# Patient Record
Sex: Female | Born: 1960 | Hispanic: Yes | Marital: Married | Smoking: Never smoker
Health system: Southern US, Community
[De-identification: ages and names within clinical notes are randomized; demographics above are authoritative.]

## PROBLEM LIST (undated history)

## (undated) DIAGNOSIS — I10 Essential (primary) hypertension: Secondary | ICD-10-CM

## (undated) DIAGNOSIS — E7800 Pure hypercholesterolemia, unspecified: Secondary | ICD-10-CM

## (undated) DIAGNOSIS — E119 Type 2 diabetes mellitus without complications: Secondary | ICD-10-CM

## (undated) HISTORY — DX: Essential (primary) hypertension: I10

---

## 2013-06-01 LAB — HM COLONOSCOPY

## 2017-07-20 HISTORY — PX: CHOLECYSTECTOMY: SHX55

## 2018-03-07 LAB — BASIC METABOLIC PANEL
BUN: 15 (ref 4–21)
Chloride: 102 (ref 99–108)
Creatinine: 0.6 (ref 0.5–1.1)
Glucose: 105
Potassium: 4.1 (ref 3.4–5.3)
Sodium: 143 (ref 137–147)

## 2018-03-07 LAB — COMPREHENSIVE METABOLIC PANEL
Calcium: 9.7 (ref 8.7–10.7)
GFR calc Af Amer: 119
GFR calc non Af Amer: 103

## 2018-03-07 LAB — LIPID PANEL
Cholesterol: 211 — AB (ref 0–200)
HDL: 69 (ref 35–70)
LDL Cholesterol: 125
Triglycerides: 84 (ref 40–160)

## 2018-03-07 LAB — HM PAP SMEAR: HM Pap smear: 100319

## 2018-03-07 LAB — HEPATIC FUNCTION PANEL
ALT: 29 (ref 7–35)
AST: 23 (ref 13–35)
Alkaline Phosphatase: 144 — AB (ref 25–125)
Bilirubin, Total: 0.3

## 2018-03-07 LAB — TSH: TSH: 1.4 (ref 0.41–5.90)

## 2018-03-07 LAB — VITAMIN D 25 HYDROXY (VIT D DEFICIENCY, FRACTURES): Vit D, 25-Hydroxy: 33

## 2018-03-07 LAB — HEMOGLOBIN A1C: Hemoglobin A1C: 6.4

## 2018-04-15 LAB — HM PAP SMEAR: HM Pap smear: NEGATIVE

## 2018-04-20 LAB — HM PAP SMEAR: HM Pap smear: NEGATIVE

## 2018-04-20 LAB — RESULTS CONSOLE HPV: CHL HPV: NEGATIVE

## 2019-08-28 ENCOUNTER — Encounter: Payer: Self-pay | Admitting: Internal Medicine

## 2019-08-28 ENCOUNTER — Ambulatory Visit (INDEPENDENT_AMBULATORY_CARE_PROVIDER_SITE_OTHER): Admitting: Internal Medicine

## 2019-08-28 ENCOUNTER — Other Ambulatory Visit: Payer: Self-pay

## 2019-08-28 VITALS — BP 136/70 | HR 63 | Temp 98.3°F | Ht 62.0 in | Wt 154.0 lb

## 2019-08-28 DIAGNOSIS — I10 Essential (primary) hypertension: Secondary | ICD-10-CM | POA: Diagnosis not present

## 2019-08-28 DIAGNOSIS — Z23 Encounter for immunization: Secondary | ICD-10-CM

## 2019-08-28 DIAGNOSIS — Z1231 Encounter for screening mammogram for malignant neoplasm of breast: Secondary | ICD-10-CM | POA: Diagnosis not present

## 2019-08-28 DIAGNOSIS — L84 Corns and callosities: Secondary | ICD-10-CM

## 2019-08-28 MED ORDER — LOSARTAN POTASSIUM 25 MG PO TABS: 25.0000 mg | ORAL_TABLET | Freq: Every day | ORAL | 1 refills | Status: DC

## 2019-08-28 NOTE — Progress Notes (Signed)
Date:  08/28/2019   Name:  Jenny Gordon   DOB:  [birth year]   MRN:  [id number]   Chief Complaint: Establish Care (Flu shot.) and Hypertension (Refill. )  Hypertension This is a chronic problem. Episode onset: 6 years ago. The problem is controlled. Pertinent negatives include no chest pain, headaches, palpitations or shortness of breath. Past treatments include angiotensin blockers. The current treatment provides significant improvement. There are no compliance problems.  There is no history of kidney disease or CAD/MI.  Foot Injury  There was no injury mechanism. The pain is present in the left foot. The pain is mild. Associated symptoms comments: Pain on the bottom of the foot with walking.    No results found for: CREATININE, BUN, NA, K, CL, CO2 No results found for: CHOL, HDL, LDLCALC, LDLDIRECT, TRIG, CHOLHDL No results found for: TSH No results found for: HGBA1C   Review of Systems  Constitutional: Negative for chills and fatigue.  HENT: Positive for congestion (mild sinus congestion).   Respiratory: Negative for chest tightness and shortness of breath.   Cardiovascular: Negative for chest pain, palpitations and leg swelling.  Gastrointestinal: Negative for abdominal pain.  Musculoskeletal: Positive for gait problem (left foot pain).  Allergic/Immunologic: Positive for environmental allergies.  Neurological: Negative for dizziness, light-headedness and headaches.  Psychiatric/Behavioral: Positive for dysphoric mood (a little sad since her ex sister in law passed away recently from Covid). Negative for sleep disturbance. The patient is not nervous/anxious.     Patient Active Problem List   Diagnosis Date Noted  . Essential hypertension 08/28/2019  . Callus of foot 08/28/2019    Allergies  Allergen Reactions  . Penicillins Hives    Past Surgical History:  Procedure Laterality Date  . CHOLECYSTECTOMY      Social History   Tobacco Use  . Smoking status: Never  Smoker  . Smokeless tobacco: Never Used  Substance Use Topics  . Alcohol use: Yes    Comment: occassional  . Drug use: Never     Medication list has been reviewed and updated.  Current Meds  Medication Sig  . Glucosamine-Chondroitin 500-400 MG CHEW Chew 1 tablet by mouth daily.  Marland Kitchen losartan (COZAAR) 25 MG tablet Take 1 tablet (25 mg total) by mouth daily.  . [DISCONTINUED] losartan (COZAAR) 25 MG tablet Take 25 mg by mouth daily.    PHQ 2/9 Scores 08/28/2019  PHQ - 2 Score 2  PHQ- 9 Score 2    BP Readings from Last 3 Encounters:  08/28/19 136/70    Physical Exam Vitals and nursing note reviewed.  Constitutional:      General: She is not in acute distress.    Appearance: Normal appearance. She is well-developed.  HENT:     Head: Normocephalic and atraumatic.  Neck:     Vascular: No carotid bruit.  Cardiovascular:     Rate and Rhythm: Normal rate and regular rhythm.     Pulses: Normal pulses.     Heart sounds: No murmur.  Pulmonary:     Effort: Pulmonary effort is normal. No respiratory distress.  Abdominal:     General: Abdomen is flat.     Palpations: Abdomen is soft.     Tenderness: There is no abdominal tenderness.  Musculoskeletal:     Cervical back: Normal range of motion.     Right lower leg: No edema.     Left lower leg: No edema.       Feet:  Lymphadenopathy:  Cervical: No cervical adenopathy.  Skin:    General: Skin is warm and dry.     Capillary Refill: Capillary refill takes less than 2 seconds.     Findings: No rash.  Neurological:     Mental Status: She is alert and oriented to person, place, and time.  Psychiatric:        Behavior: Behavior normal.        Thought Content: Thought content normal.     Wt Readings from Last 3 Encounters:  08/28/19 154 lb (69.9 kg)    BP 136/70   Pulse 63   Temp 98.3 F (36.8 C) (Oral)   Ht 5\' 2"  (1.575 m)   Wt 154 lb (69.9 kg)   LMP 07/21/1999 (Exact Date)   SpO2 97%   BMI 28.17 kg/m    Assessment and Plan: 1. Essential hypertension Clinically stable exam with well controlled BP on losartan. Tolerating medications without side effects at this time. Pt to continue current regimen and low sodium diet; benefits of regular exercise as able discussed. - losartan (COZAAR) 25 MG tablet; Take 1 tablet (25 mg total) by mouth daily.  Dispense: 90 tablet; Refill: 1  2. Callus of foot Needs to see Podiatry - Ambulatory referral to Podiatry  3. Encounter for screening mammogram for breast cancer Pt will sign for previous mammograms at Radiology then schedule - MM 3D Dora; Future  4. Need for immunization against influenza - Flu Vaccine QUAD 36+ mos IM   Partially dictated using Editor, commissioning. Any errors are unintentional.  Halina Maidens, MD [facility]  08/28/2019

## 2019-08-28 NOTE — Patient Instructions (Signed)
Calcium 1200 mg per day and Vitamin D IU 400 per day

## 2019-09-06 ENCOUNTER — Encounter: Payer: Self-pay | Admitting: Internal Medicine

## 2019-09-06 DIAGNOSIS — R935 Abnormal findings on diagnostic imaging of other abdominal regions, including retroperitoneum: Secondary | ICD-10-CM | POA: Insufficient documentation

## 2019-09-20 ENCOUNTER — Other Ambulatory Visit: Payer: Self-pay

## 2019-10-09 ENCOUNTER — Ambulatory Visit: Admitting: Internal Medicine

## 2019-10-23 ENCOUNTER — Other Ambulatory Visit: Payer: Self-pay

## 2019-10-23 ENCOUNTER — Ambulatory Visit
Admission: RE | Admit: 2019-10-23 | Discharge: 2019-10-23 | Disposition: A | Discharge status: Home / Self Care | Source: Ambulatory Visit | Attending: Internal Medicine | Admitting: Internal Medicine

## 2019-10-23 DIAGNOSIS — Z1231 Encounter for screening mammogram for malignant neoplasm of breast: Secondary | ICD-10-CM | POA: Diagnosis present

## 2020-02-03 ENCOUNTER — Other Ambulatory Visit: Payer: Self-pay | Admitting: Internal Medicine

## 2020-02-03 DIAGNOSIS — I10 Essential (primary) hypertension: Secondary | ICD-10-CM

## 2020-03-19 NOTE — Progress Notes (Signed)
Date:  03/20/2020   Name:  Jenny Gordon   DOB:  [birth year]   MRN:  [id number]   Chief Complaint: Annual Exam (Breast Exam, No pap- 3 more years.  / not taking glucosomine wants to take gummies) and Colon Cancer Screening (FIT)  Jenny Gordon is a 59 y.o. female who presents today for her Complete Annual Exam. She feels well. She reports exercising none. She reports she is sleeping well. Breast complaints none.  Mammogram: 10/2019 Pap smear: 04/2018 neg with cotesting Colonoscopy: 2017 - ? Polyp biopsied  Immunization History  Administered Date(s) Administered  . Influenza,inj,Quad PF,6+ Mos 08/28/2019  . Moderna SARS-COVID-2 Vaccination 02/28/2020, 03/20/2020    Hypertension This is a chronic problem. The problem is controlled. Pertinent negatives include no chest pain, headaches, palpitations or shortness of breath. Past treatments include angiotensin blockers. The current treatment provides significant improvement. There are no compliance problems.  There is no history of kidney disease, CAD/MI or CVA.    Lab Results  Component Value Date   CREATININE 0.6 03/07/2018   BUN 15 03/07/2018   NA 143 03/07/2018   K 4.1 03/07/2018   CL 102 03/07/2018   Lab Results  Component Value Date   CHOL 211 (A) 03/07/2018   HDL 69 03/07/2018   LDLCALC 125 03/07/2018   TRIG 84 03/07/2018   Lab Results  Component Value Date   TSH 1.40 03/07/2018   Lab Results  Component Value Date   HGBA1C 6.4 03/07/2018   No results found for: WBC, HGB, HCT, MCV, PLT Lab Results  Component Value Date   ALT 29 03/07/2018   AST 23 03/07/2018   ALKPHOS 144 (A) 03/07/2018     Review of Systems  Constitutional: Negative for chills, fatigue and fever.  HENT: Negative for congestion, hearing loss, tinnitus, trouble swallowing and voice change.   Eyes: Negative for visual disturbance.  Respiratory: Negative for cough, chest tightness, shortness of breath and wheezing.   Cardiovascular: Negative for  chest pain, palpitations and leg swelling.  Gastrointestinal: Negative for abdominal pain, constipation, diarrhea and vomiting.  Endocrine: Negative for polydipsia and polyuria.  Genitourinary: Negative for dysuria, frequency, genital sores, vaginal bleeding and vaginal discharge.  Musculoskeletal: Negative for arthralgias, gait problem and joint swelling.  Skin: Negative for color change and rash.  Neurological: Negative for dizziness, tremors, light-headedness and headaches.  Hematological: Negative for adenopathy. Does not bruise/bleed easily.  Psychiatric/Behavioral: Negative for dysphoric mood and sleep disturbance. The patient is not nervous/anxious.     Patient Active Problem List   Diagnosis Date Noted  . Abnormal MRI of abdomen 09/06/2019  . Essential hypertension 08/28/2019  . Callus of foot 08/28/2019    Allergies  Allergen Reactions  . Penicillins Hives    Past Surgical History:  Procedure Laterality Date  . CHOLECYSTECTOMY  2019    Social History   Tobacco Use  . Smoking status: Never Smoker  . Smokeless tobacco: Never Used  Vaping Use  . Vaping Use: Never assessed  Substance Use Topics  . Alcohol use: Yes    Comment: occassional  . Drug use: Never     Medication list has been reviewed and updated.  Current Meds  Medication Sig  . losartan (COZAAR) 25 MG tablet TAKE 1 TABLET(25 MG) BY MOUTH DAILY    PHQ 2/9 Scores 08/28/2019  PHQ - 2 Score 2  PHQ- 9 Score 2    No flowsheet data found.  BP Readings from Last 3 Encounters:  03/20/20 132/76  08/28/19 136/70    Physical Exam Vitals and nursing note reviewed.  Constitutional:      General: She is not in acute distress.    Appearance: She is well-developed.  HENT:     Head: Normocephalic and atraumatic.     Right Ear: Tympanic membrane and ear canal normal.     Left Ear: Tympanic membrane and ear canal normal.     Nose:     Right Sinus: No maxillary sinus tenderness.     Left Sinus: No  maxillary sinus tenderness.  Eyes:     General: No scleral icterus.       Right eye: No discharge.        Left eye: No discharge.     Conjunctiva/sclera: Conjunctivae normal.  Neck:     Thyroid: No thyromegaly.     Vascular: No carotid bruit.  Cardiovascular:     Rate and Rhythm: Normal rate and regular rhythm.     Pulses: Normal pulses.     Heart sounds: Normal heart sounds.  Pulmonary:     Effort: Pulmonary effort is normal. No respiratory distress.     Breath sounds: No wheezing.  Chest:     Breasts:        Right: No mass, nipple discharge, skin change or tenderness.        Left: No mass, nipple discharge, skin change or tenderness.  Abdominal:     General: Bowel sounds are normal.     Palpations: Abdomen is soft.     Tenderness: There is no abdominal tenderness.  Musculoskeletal:     Cervical back: Normal range of motion. No erythema.     Right knee: Crepitus present. No effusion. Normal range of motion. No tenderness.     Left knee: Crepitus present. No effusion. Normal range of motion. No tenderness.     Right lower leg: No edema.     Left lower leg: No edema.  Lymphadenopathy:     Cervical: No cervical adenopathy.  Skin:    General: Skin is warm and dry.     Capillary Refill: Capillary refill takes less than 2 seconds.     Findings: No rash.  Neurological:     General: No focal deficit present.     Mental Status: She is alert and oriented to person, place, and time.     Cranial Nerves: No cranial nerve deficit.     Sensory: No sensory deficit.     Deep Tendon Reflexes: Reflexes are normal and symmetric.  Psychiatric:        Attention and Perception: Attention normal.        Mood and Affect: Mood normal.        Behavior: Behavior normal.     Wt Readings from Last 3 Encounters:  03/20/20 154 lb (69.9 kg)  08/28/19 154 lb (69.9 kg)    BP 132/76   Pulse 70   Temp 98 F (36.7 C) (Oral)   Ht 5\' 2"  (1.575 m)   Wt 154 lb (69.9 kg)   LMP 07/21/1999 (Exact  Date)   SpO2 97%   BMI 28.17 kg/m   Assessment and Plan: 1. Annual physical exam Normal exam Needs to resume regular exercise - Lipid panel - POCT urinalysis dipstick  2. Need for hepatitis C screening test - Hepatitis C antibody  3. Colon cancer screening [place] Per patient she had it at age 58 and there was something that was biopsied  but only required monitoring - Ambulatory referral to Gastroenterology  4. Essential hypertension Clinically stable exam with well controlled BP on losartan 25 mg. Tolerating medications without side effects at this time. Pt to continue current regimen and low sodium diet; benefits of regular exercise as able discussed. - CBC with Differential/Platelet - Comprehensive metabolic panel - TSH - losartan (COZAAR) 25 MG tablet; Take 1 tablet (25 mg total) by mouth daily.  Dispense: 90 tablet; Refill: 3   Partially dictated using Animal nutritionist. Any errors are unintentional.  Bari Edward, MD [facility]  03/20/2020

## 2020-03-20 ENCOUNTER — Other Ambulatory Visit: Payer: Self-pay

## 2020-03-20 ENCOUNTER — Encounter: Payer: Self-pay | Admitting: Internal Medicine

## 2020-03-20 ENCOUNTER — Ambulatory Visit (INDEPENDENT_AMBULATORY_CARE_PROVIDER_SITE_OTHER): Admitting: Internal Medicine

## 2020-03-20 VITALS — BP 132/76 | HR 70 | Temp 98.0°F | Ht 62.0 in | Wt 154.0 lb

## 2020-03-20 DIAGNOSIS — Z0000 Encounter for general adult medical examination without abnormal findings: Secondary | ICD-10-CM | POA: Diagnosis not present

## 2020-03-20 DIAGNOSIS — Z1159 Encounter for screening for other viral diseases: Secondary | ICD-10-CM

## 2020-03-20 DIAGNOSIS — Z1211 Encounter for screening for malignant neoplasm of colon: Secondary | ICD-10-CM

## 2020-03-20 DIAGNOSIS — I10 Essential (primary) hypertension: Secondary | ICD-10-CM | POA: Diagnosis not present

## 2020-03-20 LAB — POCT URINALYSIS DIPSTICK
Bilirubin, UA: NEGATIVE
Blood, UA: NEGATIVE
Glucose, UA: NEGATIVE
Ketones, UA: NEGATIVE
Leukocytes, UA: NEGATIVE
Nitrite, UA: NEGATIVE
Protein, UA: NEGATIVE
Spec Grav, UA: <=1.005 — AB (ref 1.010–1.025)
Urobilinogen, UA: 0.2 E.U./dL
pH, UA: 7 (ref 5.0–8.0)

## 2020-03-20 MED ORDER — LOSARTAN POTASSIUM 25 MG PO TABS: 25.0000 mg | ORAL_TABLET | Freq: Every day | ORAL | 3 refills | Status: DC

## 2020-03-20 NOTE — Patient Instructions (Addendum)
Viactive chews- calcium + d chew (recommend daily: 1200 mg calcium and 400 IU vitamin D)

## 2020-03-21 ENCOUNTER — Encounter: Payer: Self-pay | Admitting: Internal Medicine

## 2020-03-21 DIAGNOSIS — E785 Hyperlipidemia, unspecified: Secondary | ICD-10-CM | POA: Insufficient documentation

## 2020-03-21 LAB — CBC WITH DIFFERENTIAL/PLATELET
Basophils Absolute: 0 10*3/uL (ref 0.0–0.2)
Basos: 1 %
EOS (ABSOLUTE): 0.1 10*3/uL (ref 0.0–0.4)
Eos: 2 %
Hematocrit: 37.8 % (ref 34.0–46.6)
Hemoglobin: 13.1 g/dL (ref 11.1–15.9)
Immature Grans (Abs): 0 10*3/uL (ref 0.0–0.1)
Immature Granulocytes: 0 %
Lymphocytes Absolute: 1.8 10*3/uL (ref 0.7–3.1)
Lymphs: 34 %
MCH: 30.4 pg (ref 26.6–33.0)
MCHC: 34.7 g/dL (ref 31.5–35.7)
MCV: 88 fL (ref 79–97)
Monocytes Absolute: 0.4 10*3/uL (ref 0.1–0.9)
Monocytes: 8 %
Neutrophils Absolute: 3 10*3/uL (ref 1.4–7.0)
Neutrophils: 55 %
Platelets: 198 10*3/uL (ref 150–450)
RBC: 4.31 x10E6/uL (ref 3.77–5.28)
RDW: 13.5 % (ref 11.7–15.4)
WBC: 5.4 10*3/uL (ref 3.4–10.8)

## 2020-03-21 LAB — COMPREHENSIVE METABOLIC PANEL
ALT: 29 IU/L (ref 0–32)
AST: 19 IU/L (ref 0–40)
Albumin/Globulin Ratio: 1.7 (ref 1.2–2.2)
Albumin: 4.7 g/dL (ref 3.8–4.9)
Alkaline Phosphatase: 129 IU/L — ABNORMAL HIGH (ref 48–121)
BUN/Creatinine Ratio: 20 (ref 9–23)
BUN: 12 mg/dL (ref 6–24)
Bilirubin Total: 0.3 mg/dL (ref 0.0–1.2)
CO2: 24 mmol/L (ref 20–29)
Calcium: 9.5 mg/dL (ref 8.7–10.2)
Chloride: 103 mmol/L (ref 96–106)
Creatinine, Ser: 0.61 mg/dL (ref 0.57–1.00)
GFR calc Af Amer: 116 mL/min/{1.73_m2} (ref >59)
GFR calc non Af Amer: 101 mL/min/{1.73_m2} (ref >59)
Globulin, Total: 2.8 g/dL (ref 1.5–4.5)
Glucose: 104 mg/dL — ABNORMAL HIGH (ref 65–99)
Potassium: 4.7 mmol/L (ref 3.5–5.2)
Sodium: 143 mmol/L (ref 134–144)
Total Protein: 7.5 g/dL (ref 6.0–8.5)

## 2020-03-21 LAB — TSH: TSH: 1.03 u[IU]/mL (ref 0.450–4.500)

## 2020-03-21 LAB — LIPID PANEL
Chol/HDL Ratio: 3.4 ratio (ref 0.0–4.4)
Cholesterol, Total: 214 mg/dL — ABNORMAL HIGH (ref 100–199)
HDL: 63 mg/dL (ref >39)
LDL Chol Calc (NIH): 137 mg/dL — ABNORMAL HIGH (ref 0–99)
Triglycerides: 81 mg/dL (ref 0–149)
VLDL Cholesterol Cal: 14 mg/dL (ref 5–40)

## 2020-03-21 LAB — HEPATITIS C ANTIBODY: Hep C Virus Ab: 0.1 s/co ratio (ref 0.0–0.9)

## 2020-04-01 ENCOUNTER — Telehealth

## 2020-04-01 ENCOUNTER — Telehealth: Payer: Self-pay

## 2020-04-01 NOTE — Telephone Encounter (Signed)
Discussed colonoscopy referral with patient this morning.  She said that Dr. 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  She also said she had a colonoscopy performed after the mass was found and that was normal.  Reviewed colonoscopy report and referral note from ([facility])  Dr. Julieanne Manson office regarding the pancreatic mass.  Patient was advised based on the colonoscopy report everything checked out normal.  Colonoscopy was performed 06/01/13.  She will not be due until 06/02/23.  In regards to the past pancreatic mass, I advised that the colonoscopy would only allow the physician to see the intestines not the pancreas.  I advised her to see if Dr. Judithann Graves wanted to follow up on that to order a CT Scan since the colonoscopy would not provide any information regarding the pancreas and colonoscopy was normal.  Referral has been canceled.  Will send patient a copy of her colonoscopy report and consultation note from [facility].  Thanks,  Marcelino Duster CMA

## 2020-05-02 ENCOUNTER — Other Ambulatory Visit: Payer: Self-pay | Admitting: Internal Medicine

## 2020-05-02 DIAGNOSIS — I10 Essential (primary) hypertension: Secondary | ICD-10-CM

## 2020-09-19 ENCOUNTER — Ambulatory Visit: Admitting: Internal Medicine

## 2020-10-08 ENCOUNTER — Other Ambulatory Visit: Payer: Self-pay

## 2020-10-08 ENCOUNTER — Ambulatory Visit: Admitting: Internal Medicine

## 2020-10-08 ENCOUNTER — Encounter: Payer: Self-pay | Admitting: Internal Medicine

## 2020-10-08 VITALS — BP 136/84 | HR 73 | Temp 97.9°F | Ht 62.0 in | Wt 157.0 lb

## 2020-10-08 DIAGNOSIS — I10 Essential (primary) hypertension: Secondary | ICD-10-CM

## 2020-10-08 DIAGNOSIS — K862 Cyst of pancreas: Secondary | ICD-10-CM | POA: Diagnosis not present

## 2020-10-08 DIAGNOSIS — R935 Abnormal findings on diagnostic imaging of other abdominal regions, including retroperitoneum: Secondary | ICD-10-CM | POA: Diagnosis not present

## 2020-10-08 NOTE — Progress Notes (Signed)
Date:  10/08/2020   Name:  Jenny Gordon   DOB:  August 18, 1960   MRN:  [id number]   Chief Complaint: Hypertension  Hypertension This is a chronic problem. The problem is controlled. Pertinent negatives include no chest pain, headaches, palpitations or shortness of breath. Past treatments include angiotensin blockers. The current treatment provides significant improvement. There are no compliance problems.    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  Some worrisome features but could be walled-off necrosis could be considered if there is a hx of pancreatitis.  Rec surgical oncology consult as well as GI consult for EUS and fluid analysis.  Her last consultation recommended annual MRI which is overdue. 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 No liver lesion. 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  Lab Results  Component Value Date   CREATININE 0.61 03/20/2020   BUN 12 03/20/2020   NA 143 03/20/2020   K 4.7 03/20/2020   CL 103 03/20/2020   CO2 24 03/20/2020   Lab Results  Component Value Date   CHOL 214 (H) 03/20/2020   HDL 63 03/20/2020   LDLCALC 137 (H) 03/20/2020   TRIG 81 03/20/2020   CHOLHDL 3.4 03/20/2020   Lab Results  Component Value Date   TSH 1.030 03/20/2020   Lab Results  Component Value Date   HGBA1C 6.4 03/07/2018   Lab Results  Component Value Date   WBC 5.4 03/20/2020   HGB 13.1 03/20/2020   HCT 37.8 03/20/2020   MCV 88 03/20/2020   PLT 198 03/20/2020   Lab Results  Component Value Date   ALT 29 03/20/2020   AST 19 03/20/2020   ALKPHOS 129 (H) 03/20/2020   BILITOT 0.3 03/20/2020     Review of Systems  Constitutional: Negative for chills, fatigue and unexpected weight change.  HENT: Negative for nosebleeds and trouble swallowing.   Eyes: Negative for visual disturbance.  Respiratory: Negative for cough,  chest tightness, shortness of breath and wheezing.   Cardiovascular: Negative for chest pain, palpitations and leg swelling.  Gastrointestinal: Negative for abdominal distention, abdominal pain, constipation and diarrhea.  Genitourinary: Negative for dysuria.  Neurological: Negative for dizziness, weakness, light-headedness and headaches.    Patient Active Problem List   Diagnosis Date Noted  . Mild hyperlipidemia 03/21/2020  . Abnormal MRI of abdomen 09/06/2019  . Essential hypertension 08/28/2019  . Callus of foot 08/28/2019    Allergies  Allergen Reactions  . Penicillins Hives    Past Surgical History:  Procedure Laterality Date  . CHOLECYSTECTOMY  2019    Social History   Tobacco Use  . Smoking status: Never Smoker  . Smokeless tobacco: Never Used  Substance Use Topics  . Alcohol use: Yes    Comment: occassional  . Drug use: Never     Medication list has been reviewed and updated.  Current Meds  Medication Sig  . Chlorpheniramine Maleate (ALLERGY PO) Take by mouth as needed.  Marland Kitchen losartan (COZAAR) 25 MG tablet TAKE 1 TABLET(25 MG) BY MOUTH DAILY    PHQ 2/9 Scores 10/08/2020 08/28/2019  PHQ - 2 Score 0 2  PHQ- 9 Score 0 2    GAD 7 : Generalized Anxiety Score 10/08/2020  Nervous, Anxious, on Edge 0  Control/stop worrying 0  Worry too much - different things 0  Trouble  relaxing 0  Restless 0  Easily annoyed or irritable 0  Afraid - awful might happen 0  Total GAD 7 Score 0    BP Readings from Last 3 Encounters:  10/08/20 136/84  03/20/20 132/76  08/28/19 136/70    Physical Exam Vitals and nursing note reviewed.  Constitutional:      General: She is not in acute distress.    Appearance: Normal appearance. She is well-developed.  HENT:     Head: Normocephalic and atraumatic.  Neck:     Vascular: No carotid bruit.  Cardiovascular:     Rate and Rhythm: Normal rate and regular rhythm.     Pulses: Normal pulses.  Pulmonary:     Effort: Pulmonary  effort is normal. No respiratory distress.     Breath sounds: No wheezing or rhonchi.  Abdominal:     General: Abdomen is flat. There is no distension.     Palpations: Abdomen is soft. There is no mass.     Tenderness: There is no abdominal tenderness.  Musculoskeletal:     Cervical back: Normal range of motion.     Right lower leg: No edema.     Left lower leg: No edema.  Lymphadenopathy:     Cervical: No cervical adenopathy.  Skin:    General: Skin is warm and dry.     Capillary Refill: Capillary refill takes less than 2 seconds.     Coloration: Skin is not jaundiced.     Findings: No rash.  Neurological:     General: No focal deficit present.     Mental Status: She is alert and oriented to person, place, and time.  Psychiatric:        Mood and Affect: Mood normal.        Behavior: Behavior normal.     Wt Readings from Last 3 Encounters:  10/08/20 157 lb (71.2 kg)  03/20/20 154 lb (69.9 kg)  08/28/19 154 lb (69.9 kg)    BP 136/84   Pulse 73   Temp 97.9 F (36.6 C) (Oral)   Ht 5\' 2"  (1.575 m)   Wt 157 lb (71.2 kg)   LMP 07/21/1999 (Exact Date)   SpO2 99%   BMI 28.72 kg/m   Assessment and Plan: 1. Essential hypertension Clinically stable exam with well controlled BP on losartan. Tolerating medications without side effects at this time. Pt to continue current regimen and low sodium diet; benefits of regular exercise as able discussed.  2. Abnormal MRI of abdomen Due for surveillance MRI of pancreatic mass - MR Abdomen W Wo Contrast; Future  3. Cyst of pancreas - MR Abdomen W Wo Contrast; Future   Partially dictated using 09/18/1999. Any errors are unintentional.  Animal nutritionist, MD [facility]  10/08/2020

## 2020-10-22 ENCOUNTER — Ambulatory Visit
Admission: RE | Admit: 2020-10-22 | Discharge: 2020-10-22 | Disposition: A | Discharge status: Home / Self Care | Source: Ambulatory Visit | Attending: Internal Medicine | Admitting: Internal Medicine

## 2020-10-22 ENCOUNTER — Other Ambulatory Visit: Payer: Self-pay

## 2020-10-22 DIAGNOSIS — R935 Abnormal findings on diagnostic imaging of other abdominal regions, including retroperitoneum: Secondary | ICD-10-CM

## 2020-10-22 DIAGNOSIS — K862 Cyst of pancreas: Secondary | ICD-10-CM | POA: Diagnosis present

## 2020-10-22 MED ORDER — GADOBUTROL 1 MMOL/ML IV SOLN
7.0000 mL | Freq: Once | INTRAVENOUS | Status: AC | PRN
  Administered 2020-10-22: 7 mL via INTRAVENOUS

## 2020-10-23 ENCOUNTER — Encounter: Payer: Self-pay | Admitting: Internal Medicine

## 2021-03-27 NOTE — Progress Notes (Signed)
Date:  03/28/2021   Name:  Jenny Gordon   DOB:  1960-10-13   MRN:  [id number]   Chief Complaint: Annual Exam (Breast Exam. No pap. UA. ) Jenny Gordon is a 61 y.o. female who presents today for her Complete Annual Exam. She feels well. She reports exercising - walking 3-4 times a week for 2 miles. She reports she is sleeping well. Breast complaints - none.  Mammogram: 10/2019 DEXA: none Pap smear: 04/2018 net with co-testing Colonoscopy: 04/2013 normal  Immunization History  Administered Date(s) Administered   Influenza,inj,Quad PF,6+ Mos 08/28/2019   Moderna Sars-Covid-2 Vaccination 02/28/2020, 03/20/2020    Hypertension This is a chronic problem. The problem is controlled. Pertinent negatives include no chest pain, headaches, palpitations or shortness of breath. Past treatments include angiotensin blockers. The current treatment provides significant improvement.   Pancreatic lesion- On the outside MRI abdomen examination from 12/07/2016 the complex but nonenhancing cystic lesion in the pancreatic head was noted and measured 6.7 by 4.3 by 4.9 cm, and was noted to be not substantially changed from an even further prior MRI abdomen from 06/08/2016. The lesion is roughly stable in volume on today's examination compared to reported priors, and the overall appearance on today's examination matches that described on the prior exams. 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 The extrinsic mass effect of this lesion on the extrahepatic biliary tree was also previously reported. Overall I feel this lesion is safe for observation given this established history, and periodic sonography would likely be satisfactory for surveillance. Lab Results  Component Value Date   CREATININE 0.61 03/20/2020   BUN 12 03/20/2020   NA 143 03/20/2020    K 4.7 03/20/2020   CL 103 03/20/2020   CO2 24 03/20/2020   Lab Results  Component Value Date   CHOL 214 (H) 03/20/2020   HDL 63 03/20/2020   LDLCALC 137 (H) 03/20/2020   TRIG 81 03/20/2020   CHOLHDL 3.4 03/20/2020   Lab Results  Component Value Date   TSH 1.030 03/20/2020   Lab Results  Component Value Date   HGBA1C 6.4 03/07/2018   Lab Results  Component Value Date   WBC 5.4 03/20/2020   HGB 13.1 03/20/2020   HCT 37.8 03/20/2020   MCV 88 03/20/2020   PLT 198 03/20/2020   Lab Results  Component Value Date   ALT 29 03/20/2020   AST 19 03/20/2020   ALKPHOS 129 (H) 03/20/2020   BILITOT 0.3 03/20/2020     Review of Systems  Constitutional:  Negative for chills, fatigue and fever.  HENT:  Negative for congestion, hearing loss, tinnitus, trouble swallowing and voice change.   Eyes:  Negative for visual disturbance.  Respiratory:  Negative for cough, chest tightness, shortness of breath and wheezing.   Cardiovascular:  Negative for chest pain, palpitations and leg swelling.  Gastrointestinal:  Negative for abdominal pain, constipation, diarrhea and vomiting.  Endocrine: Negative for polydipsia and polyuria.  Genitourinary:  Negative for dysuria, frequency, genital sores, vaginal bleeding and vaginal discharge.  Musculoskeletal:  Negative for arthralgias, gait problem and joint swelling.  Skin:  Negative for color change and rash.  Neurological:  Negative for dizziness, tremors, light-headedness and headaches.  Hematological:  Negative for adenopathy. Does not bruise/bleed easily.  Psychiatric/Behavioral:  Negative for dysphoric mood and sleep disturbance. The patient is not nervous/anxious.    Patient  Active Problem List   Diagnosis Date Noted   Mild hyperlipidemia 03/21/2020   Abnormal MRI of abdomen 09/06/2019   Essential hypertension 08/28/2019   Callus of foot 08/28/2019    Allergies  Allergen Reactions   Penicillins Hives    Past Surgical History:   Procedure Laterality Date   CHOLECYSTECTOMY  2019    Social History   Tobacco Use   Smoking status: Never   Smokeless tobacco: Never  Substance Use Topics   Alcohol use: Yes    Comment: occassional   Drug use: Never     Medication list has been reviewed and updated.  Current Meds  Medication Sig   Chlorpheniramine Maleate (ALLERGY PO) Take by mouth as needed.   losartan (COZAAR) 25 MG tablet TAKE 1 TABLET(25 MG) BY MOUTH DAILY    PHQ 2/9 Scores 03/28/2021 10/08/2020 08/28/2019  PHQ - 2 Score 0 0 2  PHQ- 9 Score 0 0 2    GAD 7 : Generalized Anxiety Score 03/28/2021 10/08/2020  Nervous, Anxious, on Edge 0 0  Control/stop worrying 0 0  Worry too much - different things 0 0  Trouble relaxing 0 0  Restless 0 0  Easily annoyed or irritable 0 0  Afraid - awful might happen 0 0  Total GAD 7 Score 0 0  Anxiety Difficulty Not difficult at all -    BP Readings from Last 3 Encounters:  03/28/21 112/70  10/08/20 136/84  03/20/20 132/76    Physical Exam Vitals and nursing note reviewed.  Constitutional:      General: She is not in acute distress.    Appearance: She is well-developed.  HENT:     Head: Normocephalic and atraumatic.     Right Ear: Tympanic membrane and ear canal normal.     Left Ear: Tympanic membrane and ear canal normal.     Nose:     Right Sinus: No maxillary sinus tenderness.     Left Sinus: No maxillary sinus tenderness.  Eyes:     General: No scleral icterus.       Right eye: No discharge.        Left eye: No discharge.     Conjunctiva/sclera: Conjunctivae normal.  Neck:     Thyroid: No thyromegaly.     Vascular: No carotid bruit.  Cardiovascular:     Rate and Rhythm: Normal rate and regular rhythm.     Pulses: Normal pulses.     Heart sounds: Normal heart sounds.  Pulmonary:     Effort: Pulmonary effort is normal. No respiratory distress.     Breath sounds: No wheezing.  Chest:  Breasts:    Right: No mass, nipple discharge, skin change or  tenderness.     Left: No mass, nipple discharge, skin change or tenderness.  Abdominal:     General: Abdomen is flat. Bowel sounds are normal.     Palpations: Abdomen is soft.     Tenderness: There is no abdominal tenderness.  Musculoskeletal:     Cervical back: Normal range of motion. No erythema.     Right lower leg: No edema.     Left lower leg: No edema.  Lymphadenopathy:     Cervical: No cervical adenopathy.  Skin:    General: Skin is warm and dry.     Findings: No rash.  Neurological:     Mental Status: She is alert and oriented to person, place, and time.     Cranial Nerves: No cranial nerve deficit.  Sensory: No sensory deficit.     Deep Tendon Reflexes: Reflexes are normal and symmetric.  Psychiatric:        Attention and Perception: Attention normal.        Mood and Affect: Mood normal.    Wt Readings from Last 3 Encounters:  03/28/21 158 lb (71.7 kg)  10/08/20 157 lb (71.2 kg)  03/20/20 154 lb (69.9 kg)    BP 112/70 (BP Location: Right Arm, Patient Position: Sitting, Cuff Size: Normal)   Pulse 65   Temp 98.2 F (36.8 C) (Oral)   Ht 5\' 2"  (1.575 m)   Wt 158 lb (71.7 kg)   LMP  (LMP Unknown) Comment: menopause age 38  SpO2 96%   BMI 28.90 kg/m   Assessment and Plan: 1. Annual physical exam Normal exam. Continue healthy diet and exercise She declines all vaccines today. - TSH - Lipid panel  2. Encounter for screening mammogram for breast cancer - MM 3D SCREEN BREAST BILATERAL  3. Colon cancer screening Due in 2024  4. Essential hypertension Clinically stable exam with well controlled BP. Tolerating medications without side effects at this time. Pt to continue current regimen and low sodium diet; benefits of regular exercise as able discussed. - Comprehensive metabolic panel - CBC with Differential/Platelet - POCT Urinalysis Dipstick  5. Abnormal MRI of abdomen MRI in 10/2020 appeared to be stable since 2017. Recommendation for annual  sonography which would be due 10/2021. Discussed with patient and she is agreeable.   Partially dictated using 11/2021. Any errors are unintentional.  Animal nutritionist, MD [facility]  03/28/2021

## 2021-03-28 ENCOUNTER — Ambulatory Visit (INDEPENDENT_AMBULATORY_CARE_PROVIDER_SITE_OTHER): Admitting: Internal Medicine

## 2021-03-28 ENCOUNTER — Other Ambulatory Visit: Payer: Self-pay

## 2021-03-28 ENCOUNTER — Encounter: Payer: Self-pay | Admitting: Internal Medicine

## 2021-03-28 VITALS — BP 112/70 | HR 65 | Temp 98.2°F | Ht 62.0 in | Wt 158.0 lb

## 2021-03-28 DIAGNOSIS — I10 Essential (primary) hypertension: Secondary | ICD-10-CM

## 2021-03-28 DIAGNOSIS — Z1231 Encounter for screening mammogram for malignant neoplasm of breast: Secondary | ICD-10-CM | POA: Diagnosis not present

## 2021-03-28 DIAGNOSIS — Z1211 Encounter for screening for malignant neoplasm of colon: Secondary | ICD-10-CM

## 2021-03-28 DIAGNOSIS — Z0000 Encounter for general adult medical examination without abnormal findings: Secondary | ICD-10-CM | POA: Diagnosis not present

## 2021-03-28 DIAGNOSIS — R935 Abnormal findings on diagnostic imaging of other abdominal regions, including retroperitoneum: Secondary | ICD-10-CM

## 2021-03-28 LAB — POCT URINALYSIS DIPSTICK
Bilirubin, UA: NEGATIVE
Blood, UA: NEGATIVE
Glucose, UA: NEGATIVE
Ketones, UA: NEGATIVE
Leukocytes, UA: NEGATIVE
Nitrite, UA: NEGATIVE
Protein, UA: NEGATIVE
Spec Grav, UA: <=1.005 — AB (ref 1.010–1.025)
Urobilinogen, UA: 0.2 E.U./dL
pH, UA: 6.5 (ref 5.0–8.0)

## 2021-03-29 LAB — COMPREHENSIVE METABOLIC PANEL
ALT: 27 IU/L (ref 0–32)
AST: 22 IU/L (ref 0–40)
Albumin/Globulin Ratio: 2 (ref 1.2–2.2)
Albumin: 4.8 g/dL (ref 3.8–4.9)
Alkaline Phosphatase: 130 IU/L — ABNORMAL HIGH (ref 44–121)
BUN/Creatinine Ratio: 18 (ref 9–23)
BUN: 12 mg/dL (ref 6–24)
Bilirubin Total: 0.4 mg/dL (ref 0.0–1.2)
CO2: 25 mmol/L (ref 20–29)
Calcium: 10 mg/dL (ref 8.7–10.2)
Chloride: 101 mmol/L (ref 96–106)
Creatinine, Ser: 0.66 mg/dL (ref 0.57–1.00)
Globulin, Total: 2.4 g/dL (ref 1.5–4.5)
Glucose: 120 mg/dL — ABNORMAL HIGH (ref 65–99)
Potassium: 4.4 mmol/L (ref 3.5–5.2)
Sodium: 141 mmol/L (ref 134–144)
Total Protein: 7.2 g/dL (ref 6.0–8.5)
eGFR: 102 mL/min/{1.73_m2} (ref >59)

## 2021-03-29 LAB — CBC WITH DIFFERENTIAL/PLATELET
Basophils Absolute: 0 10*3/uL (ref 0.0–0.2)
Basos: 1 %
EOS (ABSOLUTE): 0.2 10*3/uL (ref 0.0–0.4)
Eos: 3 %
Hematocrit: 40.3 % (ref 34.0–46.6)
Hemoglobin: 13.6 g/dL (ref 11.1–15.9)
Immature Grans (Abs): 0 10*3/uL (ref 0.0–0.1)
Immature Granulocytes: 0 %
Lymphocytes Absolute: 2.2 10*3/uL (ref 0.7–3.1)
Lymphs: 38 %
MCH: 29.1 pg (ref 26.6–33.0)
MCHC: 33.7 g/dL (ref 31.5–35.7)
MCV: 86 fL (ref 79–97)
Monocytes Absolute: 0.4 10*3/uL (ref 0.1–0.9)
Monocytes: 8 %
Neutrophils Absolute: 2.9 10*3/uL (ref 1.4–7.0)
Neutrophils: 50 %
Platelets: 233 10*3/uL (ref 150–450)
RBC: 4.67 x10E6/uL (ref 3.77–5.28)
RDW: 13.5 % (ref 11.7–15.4)
WBC: 5.8 10*3/uL (ref 3.4–10.8)

## 2021-03-29 LAB — LIPID PANEL
Chol/HDL Ratio: 3.5 ratio (ref 0.0–4.4)
Cholesterol, Total: 222 mg/dL — ABNORMAL HIGH (ref 100–199)
HDL: 64 mg/dL (ref >39)
LDL Chol Calc (NIH): 136 mg/dL — ABNORMAL HIGH (ref 0–99)
Triglycerides: 123 mg/dL (ref 0–149)
VLDL Cholesterol Cal: 22 mg/dL (ref 5–40)

## 2021-03-29 LAB — TSH: TSH: 1.12 u[IU]/mL (ref 0.450–4.500)

## 2021-03-31 NOTE — Progress Notes (Signed)
A1C added R73.09.  KP 

## 2021-04-01 LAB — SPECIMEN STATUS REPORT

## 2021-04-01 LAB — HGB A1C W/O EAG: Hgb A1c MFr Bld: 6.5 % — ABNORMAL HIGH (ref 4.8–5.6)

## 2021-05-07 ENCOUNTER — Ambulatory Visit

## 2021-05-22 ENCOUNTER — Other Ambulatory Visit: Payer: Self-pay

## 2021-05-22 ENCOUNTER — Ambulatory Visit
Admission: RE | Admit: 2021-05-22 | Discharge: 2021-05-22 | Disposition: A | Discharge status: Home / Self Care | Source: Ambulatory Visit | Attending: Internal Medicine | Admitting: Internal Medicine

## 2021-05-22 DIAGNOSIS — Z1231 Encounter for screening mammogram for malignant neoplasm of breast: Secondary | ICD-10-CM | POA: Diagnosis not present

## 2021-05-27 ENCOUNTER — Encounter: Payer: Self-pay | Admitting: Internal Medicine

## 2021-06-08 ENCOUNTER — Other Ambulatory Visit: Payer: Self-pay | Admitting: Internal Medicine

## 2021-06-08 DIAGNOSIS — I10 Essential (primary) hypertension: Secondary | ICD-10-CM

## 2021-06-08 NOTE — Telephone Encounter (Signed)
Requested Prescriptions  Pending Prescriptions Disp Refills  . losartan (COZAAR) 25 MG tablet [Pharmacy Med Name: LOSARTAN 25MG  TABLETS] 90 tablet 1    Sig: TAKE 1 TABLET(25 MG) BY MOUTH DAILY     Cardiovascular:  Angiotensin Receptor Blockers Passed - 06/08/2021  3:39 AM      Passed - Cr in normal range and within 180 days    Creatinine, Ser  Date Value Ref Range Status  03/28/2021 0.66 0.57 - 1.00 mg/dL Final         Passed - K in normal range and within 180 days    Potassium  Date Value Ref Range Status  03/28/2021 4.4 3.5 - 5.2 mmol/L Final         Passed - Patient is not pregnant      Passed - Last BP in normal range    BP Readings from Last 1 Encounters:  03/28/21 112/70         Passed - Valid encounter within last 6 months    Recent Outpatient Visits          2 months ago Annual physical exam   [facility], MD   8 months ago Essential hypertension   [facility], [place]   1 year ago Annual physical exam   [facility], MD   1 year ago Essential hypertension   [facility], [place]      Future Appointments            In 3 months Reubin Milan Judithann Graves, MD [facility]   In 9 months [facility], Judithann Graves, MD [facility], [facility]

## 2021-07-15 ENCOUNTER — Other Ambulatory Visit: Payer: Self-pay

## 2021-07-15 DIAGNOSIS — I10 Essential (primary) hypertension: Secondary | ICD-10-CM

## 2021-07-15 MED ORDER — LOSARTAN POTASSIUM 25 MG PO TABS: ORAL_TABLET | ORAL | 0 refills | Status: DC

## 2021-09-10 ENCOUNTER — Other Ambulatory Visit: Payer: Self-pay | Admitting: Internal Medicine

## 2021-09-10 DIAGNOSIS — I10 Essential (primary) hypertension: Secondary | ICD-10-CM

## 2021-09-10 MED ORDER — LOSARTAN POTASSIUM 25 MG PO TABS: ORAL_TABLET | ORAL | 0 refills | Status: DC

## 2021-09-10 NOTE — Telephone Encounter (Signed)
Pt following up on a refill request for   losartan (COZAAR) 25 MG tablet.  Pt states [facility] has ben trying to get refills on this med, even though it is not due. (Due 3/27) Pt just wants to ensure she gets her medication and there is not a problem. She just started using [facility]

## 2021-09-10 NOTE — Telephone Encounter (Signed)
Requested Prescriptions  Pending Prescriptions Disp Refills   losartan (COZAAR) 25 MG tablet 90 tablet 0    Sig: TAKE 1 TABLET(25 MG) BY MOUTH DAILY     Cardiovascular:  Angiotensin Receptor Blockers Passed - 09/10/2021 12:39 PM      Passed - Cr in normal range and within 180 days    Creatinine, Ser  Date Value Ref Range Status  03/28/2021 0.66 0.57 - 1.00 mg/dL Final         Passed - K in normal range and within 180 days    Potassium  Date Value Ref Range Status  03/28/2021 4.4 3.5 - 5.2 mmol/L Final         Passed - Patient is not pregnant      Passed - Last BP in normal range    BP Readings from Last 1 Encounters:  03/28/21 112/70         Passed - Valid encounter within last 6 months    Recent Outpatient Visits          5 months ago Annual physical exam   [facility] Glean Hess, MD   11 months ago Essential hypertension   [facility] Glean Hess, MD   1 year ago Annual physical exam   [facility] Glean Hess, MD   2 years ago Essential hypertension   [facility] Glean Hess, MD      Future Appointments            In 2 weeks Army Melia Jesse Sans, MD [facility], [place]   In 6 months Army Melia, Jesse Sans, MD [facility], [facility]

## 2021-09-25 ENCOUNTER — Ambulatory Visit: Admitting: Internal Medicine

## 2021-10-07 ENCOUNTER — Other Ambulatory Visit: Payer: Self-pay

## 2021-10-07 ENCOUNTER — Encounter: Payer: Self-pay | Admitting: Internal Medicine

## 2021-10-07 ENCOUNTER — Ambulatory Visit (INDEPENDENT_AMBULATORY_CARE_PROVIDER_SITE_OTHER): Admitting: Internal Medicine

## 2021-10-07 VITALS — BP 136/80 | HR 78 | Ht 62.0 in | Wt 159.0 lb

## 2021-10-07 DIAGNOSIS — R935 Abnormal findings on diagnostic imaging of other abdominal regions, including retroperitoneum: Secondary | ICD-10-CM | POA: Diagnosis not present

## 2021-10-07 DIAGNOSIS — I10 Essential (primary) hypertension: Secondary | ICD-10-CM | POA: Diagnosis not present

## 2021-10-07 NOTE — Progress Notes (Signed)
? ? ?Date:  10/07/2021  ? ?Name:  Jenny Gordon   DOB:  10-07-1960   MRN:  [id number] ? ? ?Chief Complaint: Hypertension ? ?Hypertension ?This is a chronic problem. The problem is controlled. Pertinent negatives include no chest pain, headaches, palpitations or shortness of breath. Past treatments include angiotensin blockers. There is no history of kidney disease, CAD/MI or CVA.  ?Pancreatic lesion - being followed by [place].  Due for annual study next month. ? ?Lab Results  ?Component Value Date  ? NA 141 03/28/2021  ? K 4.4 03/28/2021  ? CO2 25 03/28/2021  ? GLUCOSE 120 (H) 03/28/2021  ? BUN 12 03/28/2021  ? CREATININE 0.66 03/28/2021  ? CALCIUM 10.0 03/28/2021  ? EGFR 102 03/28/2021  ? GFRNONAA 101 03/20/2020  ? ?Lab Results  ?Component Value Date  ? CHOL 222 (H) 03/28/2021  ? HDL 64 03/28/2021  ? LDLCALC 136 (H) 03/28/2021  ? TRIG 123 03/28/2021  ? CHOLHDL 3.5 03/28/2021  ? ?Lab Results  ?Component Value Date  ? TSH 1.120 03/28/2021  ? ?Lab Results  ?Component Value Date  ? HGBA1C 6.5 (H) 03/28/2021  ? ?Lab Results  ?Component Value Date  ? WBC 5.8 03/28/2021  ? HGB 13.6 03/28/2021  ? HCT 40.3 03/28/2021  ? MCV 86 03/28/2021  ? PLT 233 03/28/2021  ? ?Lab Results  ?Component Value Date  ? ALT 27 03/28/2021  ? AST 22 03/28/2021  ? ALKPHOS 130 (H) 03/28/2021  ? BILITOT 0.4 03/28/2021  ? ?Lab Results  ?Component Value Date  ? VD25OH 33 03/07/2018  ?  ? ?Review of Systems  ?Constitutional:  Negative for fatigue and unexpected weight change.  ?HENT:  Negative for nosebleeds.   ?Eyes:  Negative for visual disturbance.  ?Respiratory:  Negative for cough, chest tightness, shortness of breath and wheezing.   ?Cardiovascular:  Negative for chest pain, palpitations and leg swelling.  ?Gastrointestinal:  Negative for abdominal pain, constipation and diarrhea.  ?Neurological:  Negative for dizziness, weakness, light-headedness and headaches.  ?Psychiatric/Behavioral:  Negative for dysphoric mood and sleep disturbance. The patient is  not nervous/anxious.   ? ?Patient Active Problem List  ? Diagnosis Date Noted  ? Mild hyperlipidemia 03/21/2020  ? Abnormal MRI of abdomen 09/06/2019  ? Essential hypertension 08/28/2019  ? Callus of foot 08/28/2019  ? ? ?Allergies  ?Allergen Reactions  ? Penicillins Hives  ? ? ?Past Surgical History:  ?Procedure Laterality Date  ? CHOLECYSTECTOMY  2019  ? ? ?Social History  ? ?Tobacco Use  ? Smoking status: Never  ? Smokeless tobacco: Never  ?Substance Use Topics  ? Alcohol use: Yes  ?  Comment: occassional  ? Drug use: Never  ? ? ? ?Medication list has been reviewed and updated. ? ?Current Meds  ?Medication Sig  ? Chlorpheniramine Maleate (ALLERGY PO) Take by mouth as needed.  ? losartan (COZAAR) 25 MG tablet TAKE 1 TABLET(25 MG) BY MOUTH DAILY  ? ? ?PHQ 2/9 Scores 10/07/2021 03/28/2021 10/08/2020 08/28/2019  ?PHQ - 2 Score 0 0 0 2  ?PHQ- 9 Score 0 0 0 2  ? ? ?GAD 7 : Generalized Anxiety Score 10/07/2021 03/28/2021 10/08/2020  ?Nervous, Anxious, on Edge 0 0 0  ?Control/stop worrying 0 0 0  ?Worry too much - different things 0 0 0  ?Trouble relaxing 0 0 0  ?Restless 0 0 0  ?Easily annoyed or irritable 0 0 0  ?Afraid - awful might happen 0 0 0  ?Total GAD 7 Score 0 0  0  ?Anxiety Difficulty Not difficult at all Not difficult at all -  ? ? ?BP Readings from Last 3 Encounters:  ?10/07/21 136/80  ?03/28/21 112/70  ?10/08/20 136/84  ? ? ?Physical Exam ?Vitals and nursing note reviewed.  ?Constitutional:   ?   General: She is not in acute distress. ?   Appearance: She is well-developed.  ?HENT:  ?   Head: Normocephalic and atraumatic.  ?Cardiovascular:  ?   Rate and Rhythm: Normal rate and regular rhythm.  ?   Pulses: Normal pulses.  ?   Heart sounds: No murmur heard. ?Pulmonary:  ?   Effort: Pulmonary effort is normal. No respiratory distress.  ?Abdominal:  ?   General: Abdomen is flat. Bowel sounds are normal.  ?   Palpations: Abdomen is soft.  ?   Tenderness: There is no abdominal tenderness.  ?Musculoskeletal:  ?   Right lower  leg: No edema.  ?   Left lower leg: No edema.  ?Skin: ?   General: Skin is warm and dry.  ?   Capillary Refill: Capillary refill takes less than 2 seconds.  ?   Findings: No rash.  ?Neurological:  ?   General: No focal deficit present.  ?   Mental Status: She is alert and oriented to person, place, and time.  ?Psychiatric:     ?   Mood and Affect: Mood normal.     ?   Behavior: Behavior normal.  ? ? ?Wt Readings from Last 3 Encounters:  ?10/07/21 159 lb (72.1 kg)  ?03/28/21 158 lb (71.7 kg)  ?10/08/20 157 lb (71.2 kg)  ? ? ?BP 136/80   Pulse 78   Ht 5' 2"  (1.575 m)   Wt 159 lb (72.1 kg)   LMP  (LMP Unknown) Comment: menopause age 18  SpO2 98%   BMI 29.08 kg/m?  ? ?Assessment and Plan: ?1. Essential hypertension ?Clinically stable exam with well controlled BP. ?Tolerating medications without side effects at this time. ?Pt to continue current regimen and low sodium diet; benefits of regular exercise as able discussed. ? ?2. Abnormal MRI of abdomen ?Per radiology recommendations this can be followed by [place]. ?No abdominal symptoms noted. ?- US Abdomen Complete; Future ? ? ?Partially dictated using Editor, commissioning. Any errors are unintentional. ? ?Halina Maidens, MD ?[facility] ?[facility] ? ?10/07/2021 ? ? ? ? ? ?

## 2021-10-08 ENCOUNTER — Ambulatory Visit

## 2021-10-17 ENCOUNTER — Ambulatory Visit

## 2021-10-22 ENCOUNTER — Other Ambulatory Visit: Payer: Self-pay

## 2021-10-30 ENCOUNTER — Ambulatory Visit
Admission: RE | Admit: 2021-10-30 | Discharge: 2021-10-30 | Disposition: A | Discharge status: Home / Self Care | Source: Ambulatory Visit | Attending: Internal Medicine | Admitting: Internal Medicine

## 2021-10-30 DIAGNOSIS — R935 Abnormal findings on diagnostic imaging of other abdominal regions, including retroperitoneum: Secondary | ICD-10-CM | POA: Diagnosis present

## 2021-10-31 ENCOUNTER — Other Ambulatory Visit: Payer: Self-pay | Admitting: Internal Medicine

## 2021-10-31 ENCOUNTER — Telehealth: Payer: Self-pay

## 2021-10-31 DIAGNOSIS — K862 Cyst of pancreas: Secondary | ICD-10-CM

## 2021-10-31 NOTE — Telephone Encounter (Signed)
Received call from Diane at [facility] radiology.  ?Requesting Dr. Judithann Graves review results from [place] yesterday. ? ?US Abdomen Complete (Accession [id number]) (Order [id number]) ?Imaging ?Date: 10/30/2021 Department: [facility] ULTR[facility]OUND Released By: Lodema Hong Authorizing: Reubin Milan, MD  ? ?Exam Status ? ?Status  ?Final [99]  ? ?PACS Intelerad Image Link ? ? Show images for US Abdomen Complete ?Study Result ? ?Narrative & Impression  ?CLINICAL DATA:  Follow-up known pancreatic lesion/mass. ?  ?EXAM: ?ABDOMEN ULTRASOUND COMPLETE ?  ?COMPARISON:  MRI October 22, 2020 ?  ?FINDINGS: ?Gallbladder: Surgically absent ?  ?Common bile duct: Diameter: 11.6 mm ?  ?Liver: No focal lesion identified. Within normal limits in ?parenchymal echogenicity. Portal vein is patent on color Doppler ?imaging with normal direction of blood flow towards the liver. ?  ?IVC: No abnormality visualized. ?  ?Pancreas: The known pancreatic mass is identified in the region of ?the pancreatic head measuring 7.2 x 5.6 x 5.1 cm today. This mass ?measured 7.0 by 5.3 x 3.5 cm on the MRI dated October 22, 2020. By ?report, there had not been significant change dating back to 2017. ?This mass demonstrates dense posterior shadowing and the internal ?character of this mass is not well evaluated on this study. ?  ?Spleen: Size and appearance within normal limits. ?  ?Right Kidney: Length: 10.9 cm. Contains a 1.8 cm simple cyst. No ?follow-up imaging recommended. ?  ?Left Kidney: Length: 10.6 cm. Echogenicity within normal limits. No ?mass or hydronephrosis visualized. ?  ?Abdominal aorta: No aneurysm visualized. ?  ?Other findings: None. ?  ?IMPRESSION: ?1. The known pancreatic mass measures 7.2 x 5.6 x 5.1 cm today ?versus 7.0 x 5.3 x 3.5 cm previously. By report, the mass had not ?grown compared to previous MRIs dated as early as 2017. On today's ?imaging, the mass measures a little larger. The difference could be ?due to difference  in modality. Also, this mass demonstrates dense ?shadowing today precluding evaluation of the internal contents which ?were much better evaluated on MRI. There may be a densely shadowing ?calcific rim. Since a small amount of interval growth is not ?excluded based on today's imaging, an MRI is recommended to ensure ?that there has been no true change. After the recommended MRI. A ?decision can be made whether to follow this mass with MRI or ?ultrasound. ?2. The common bile duct measures 11.6 mm. Previous imaging has ?demonstrated a dilated duct due to the pancreatic mass. ?3. No other abnormalities. ?  ?These results will be called to the ordering clinician or ?representative by the Radiologist Assistant, and communication ?documented in the PACS or [employer]. ?  ?  ?Electronically Signed ?  By: Gerome Sam III M.D. ?  On: 10/30/2021 18:52 ?   ? ?Result History ? ?US Abdomen Complete (Order #[id number]) on 10/30/2021 - Order Result History Report ? ?MyChart Results Release ? ?MyChart Status: Active  Results Release  ? ?Encounter-Level Documents on 10/30/2021: ? ?Electronic signature on 10/30/2021 7:48 AM - 1 of 3 e-signatures recorded ?

## 2021-10-31 NOTE — Telephone Encounter (Signed)
Please review.  KP

## 2021-11-12 ENCOUNTER — Ambulatory Visit
Admission: RE | Admit: 2021-11-12 | Discharge: 2021-11-12 | Disposition: A | Discharge status: Home / Self Care | Source: Ambulatory Visit | Attending: Internal Medicine | Admitting: Internal Medicine

## 2021-11-12 DIAGNOSIS — K862 Cyst of pancreas: Secondary | ICD-10-CM | POA: Diagnosis present

## 2021-11-12 MED ORDER — GADOBUTROL 1 MMOL/ML IV SOLN
7.5000 mL | Freq: Once | INTRAVENOUS | Status: AC | PRN
  Administered 2021-11-12: 7.5 mL via INTRAVENOUS

## 2022-03-31 ENCOUNTER — Encounter: Admitting: Internal Medicine

## 2022-04-09 ENCOUNTER — Other Ambulatory Visit: Payer: Self-pay | Admitting: Internal Medicine

## 2022-04-09 ENCOUNTER — Other Ambulatory Visit: Payer: Self-pay

## 2022-04-09 DIAGNOSIS — I10 Essential (primary) hypertension: Secondary | ICD-10-CM

## 2022-04-09 NOTE — Telephone Encounter (Signed)
Requested medication (s) are due for refill today: yes  Requested medication (s) are on the active medication list: yes  Last refill:  09/10/21 #90  Future visit scheduled: yes  Notes to clinic:  overdue lab work   Requested Prescriptions  Pending Prescriptions Disp Refills   losartan (COZAAR) 25 MG tablet [Pharmacy Med Name: LOSARTAN 25MG  TABLETS] 90 tablet 0    Sig: TAKE 1 TABLET(25 MG) BY MOUTH DAILY     Cardiovascular:  Angiotensin Receptor Blockers Failed - 04/09/2022  3:38 AM      Failed - Cr in normal range and within 180 days    Creatinine, Ser  Date Value Ref Range Status  03/28/2021 0.66 0.57 - 1.00 mg/dL Final         Failed - K in normal range and within 180 days    Potassium  Date Value Ref Range Status  03/28/2021 4.4 3.5 - 5.2 mmol/L Final         Failed - Valid encounter within last 6 months    Recent Outpatient Visits           6 months ago Essential hypertension   [place] [facility] [place], Jesse Sans, MD   1 year ago Annual physical exam   [place] [facility], Jesse Sans, MD   1 year ago Essential hypertension   [place] [facility], Jesse Sans, MD   2 years ago Annual physical exam   [place] [facility] at [facility], Jesse Sans, MD   2 years ago Essential hypertension    [facility] and [facility] Medicine at [facility], Jesse Sans, MD       Future Appointments             In 1 month Army Melia Jesse Sans, MD [facility] and [facility] Medicine at [facility], [place] - Patient is not pregnant      Passed - Last BP in normal range    BP Readings from Last 1 Encounters:  10/07/21 136/80

## 2022-04-10 NOTE — Telephone Encounter (Signed)
Refilled 04/09/22. Requested Prescriptions  Refused Prescriptions Disp Refills  . losartan (COZAAR) 25 MG tablet [Pharmacy Med Name: LOSARTAN 25MG  TABLETS] 90 tablet     Sig: TAKE 1 TABLET(25 MG) BY MOUTH DAILY     Cardiovascular:  Angiotensin Receptor Blockers Failed - 04/09/2022  2:05 PM      Failed - Cr in normal range and within 180 days    Creatinine, Ser  Date Value Ref Range Status  03/28/2021 0.66 0.57 - 1.00 mg/dL Final         Failed - K in normal range and within 180 days    Potassium  Date Value Ref Range Status  03/28/2021 4.4 3.5 - 5.2 mmol/L Final         Failed - Valid encounter within last 6 months    Recent Outpatient Visits          6 months ago Essential hypertension   [place] [facility] [place], Jesse Sans, MD   1 year ago Annual physical exam   [place] [facility] at [facility], Jesse Sans, MD   1 year ago Essential hypertension   [place] [facility] at [facility], Jesse Sans, MD   2 years ago Annual physical exam   [place] [facility] at [facility], Jesse Sans, MD   2 years ago Essential hypertension   [place] [facility] at [facility], Jesse Sans, MD      Future Appointments            In 1 month Army Melia Jesse Sans, MD [facility] and [facility] at [facility], [place] - Patient is not pregnant      Passed - Last BP in normal range    BP Readings from Last 1 Encounters:  10/07/21 136/80

## 2022-04-21 ENCOUNTER — Other Ambulatory Visit: Payer: Self-pay | Admitting: Internal Medicine

## 2022-04-21 DIAGNOSIS — Z1231 Encounter for screening mammogram for malignant neoplasm of breast: Secondary | ICD-10-CM

## 2022-05-25 ENCOUNTER — Ambulatory Visit
Admission: RE | Admit: 2022-05-25 | Discharge: 2022-05-25 | Disposition: A | Discharge status: Home / Self Care | Source: Ambulatory Visit | Attending: Internal Medicine | Admitting: Internal Medicine

## 2022-05-25 DIAGNOSIS — Z1231 Encounter for screening mammogram for malignant neoplasm of breast: Secondary | ICD-10-CM | POA: Insufficient documentation

## 2022-05-26 ENCOUNTER — Other Ambulatory Visit: Payer: Self-pay | Admitting: Internal Medicine

## 2022-05-26 DIAGNOSIS — R928 Other abnormal and inconclusive findings on diagnostic imaging of breast: Secondary | ICD-10-CM

## 2022-05-26 DIAGNOSIS — R921 Mammographic calcification found on diagnostic imaging of breast: Secondary | ICD-10-CM

## 2022-05-28 ENCOUNTER — Telehealth: Payer: Self-pay | Admitting: Internal Medicine

## 2022-05-28 DIAGNOSIS — I10 Essential (primary) hypertension: Secondary | ICD-10-CM

## 2022-05-28 NOTE — Telephone Encounter (Signed)
Requested medication (s) are due for refill today: yes  Requested medication (s) are on the active medication list: yes  Last refill:  04/09/22 #30  Future visit scheduled: yes in 3 months  Notes to clinic:  overdue lab work   Requested Prescriptions  Pending Prescriptions Disp Refills   losartan (COZAAR) 25 MG tablet [Pharmacy Med Name: LOSARTAN 25MG  TABLETS] 30 tablet 0    Sig: TAKE 1 TABLET(25 MG) BY MOUTH DAILY     Cardiovascular:  Angiotensin Receptor Blockers Failed - 05/28/2022  3:39 AM      Failed - Cr in normal range and within 180 days    Creatinine, Ser  Date Value Ref Range Status  03/28/2021 0.66 0.57 - 1.00 mg/dL Final         Failed - K in normal range and within 180 days    Potassium  Date Value Ref Range Status  03/28/2021 4.4 3.5 - 5.2 mmol/L Final         Failed - Valid encounter within last 6 months    Recent Outpatient Visits           7 months ago Essential hypertension   [place] [facility] at [facility], [facility], [place]   1 year ago Annual physical exam   [place] [facility] at [facility], [facility], [place]   1 year ago Essential hypertension   [place] [facility] at [facility], [facility], [place]   2 years ago Annual physical exam   [place] [facility] at [facility], [facility], [place]   2 years ago Essential hypertension   [place] [facility] at [facility], [facility], [place]       Future Appointments             In 3 months Nyoka Cowden Judithann Graves, MD [facility] at [facility], [facility]            Passed - Patient is not pregnant      Passed - Last BP in normal range    BP Readings from Last 1 Encounters:  10/07/21 136/80

## 2022-06-02 ENCOUNTER — Encounter: Admitting: Internal Medicine

## 2022-06-02 ENCOUNTER — Other Ambulatory Visit: Payer: Self-pay

## 2022-06-02 DIAGNOSIS — I10 Essential (primary) hypertension: Secondary | ICD-10-CM

## 2022-06-02 MED ORDER — LOSARTAN POTASSIUM 25 MG PO TABS: ORAL_TABLET | ORAL | 0 refills | Status: DC

## 2022-06-02 NOTE — Telephone Encounter (Addendum)
Pt called about refill for losartan (COZAAR) 25 MG tablet / pt was scheduled for a CPE appt that was suppose to be today but the office called her to reschedule the appt and it was moved to 2.9.2024/ pt is asking will she not be able to get her refill when the office cancelled her appt / please call pt to advised about this refill / she will need a 90 day supply until her rescheduled appt

## 2022-06-02 NOTE — Telephone Encounter (Signed)
Refilled medication to [facility].

## 2022-06-09 ENCOUNTER — Ambulatory Visit: Admitting: Internal Medicine

## 2022-06-16 ENCOUNTER — Ambulatory Visit
Admission: RE | Admit: 2022-06-16 | Discharge: 2022-06-16 | Disposition: A | Discharge status: Home / Self Care | Source: Ambulatory Visit | Attending: Internal Medicine | Admitting: Internal Medicine

## 2022-06-16 DIAGNOSIS — R921 Mammographic calcification found on diagnostic imaging of breast: Secondary | ICD-10-CM | POA: Diagnosis present

## 2022-06-16 DIAGNOSIS — R928 Other abnormal and inconclusive findings on diagnostic imaging of breast: Secondary | ICD-10-CM | POA: Insufficient documentation

## 2022-07-07 LAB — HM DIABETES EYE EXAM

## 2022-08-28 ENCOUNTER — Encounter: Payer: Self-pay | Admitting: Internal Medicine

## 2022-08-28 ENCOUNTER — Ambulatory Visit (INDEPENDENT_AMBULATORY_CARE_PROVIDER_SITE_OTHER): Admitting: Internal Medicine

## 2022-08-28 VITALS — BP 136/86 | HR 82 | Ht 62.0 in | Wt 163.4 lb

## 2022-08-28 DIAGNOSIS — Z0000 Encounter for general adult medical examination without abnormal findings: Secondary | ICD-10-CM | POA: Diagnosis not present

## 2022-08-28 DIAGNOSIS — R928 Other abnormal and inconclusive findings on diagnostic imaging of breast: Secondary | ICD-10-CM | POA: Diagnosis not present

## 2022-08-28 DIAGNOSIS — R7303 Prediabetes: Secondary | ICD-10-CM

## 2022-08-28 DIAGNOSIS — Z1211 Encounter for screening for malignant neoplasm of colon: Secondary | ICD-10-CM | POA: Diagnosis not present

## 2022-08-28 DIAGNOSIS — E118 Type 2 diabetes mellitus with unspecified complications: Secondary | ICD-10-CM | POA: Insufficient documentation

## 2022-08-28 DIAGNOSIS — I10 Essential (primary) hypertension: Secondary | ICD-10-CM | POA: Diagnosis not present

## 2022-08-28 DIAGNOSIS — E785 Hyperlipidemia, unspecified: Secondary | ICD-10-CM | POA: Diagnosis not present

## 2022-08-28 MED ORDER — LOSARTAN POTASSIUM 50 MG PO TABS: 50.0000 mg | ORAL_TABLET | Freq: Every day | ORAL | 1 refills | Status: DC

## 2022-08-28 NOTE — Assessment & Plan Note (Signed)
Last A1C 6.5 No medications prescribed but encouraged ongoing diet changes and weight loss

## 2022-08-28 NOTE — Progress Notes (Signed)
Date:  08/28/2022   Name:  Jenny Gordon   DOB:  Aug 15, 1960   MRN:  [id number]   Chief Complaint: Annual Exam Jenny Gordon is a 62 y.o. female who presents today for her Complete Annual Exam. She feels well. She reports exercising/walking. She reports she is sleeping well. Breast complaints - none.  Mammogram: 05/2022 Right Ca++ - rec Dx R in 6 mo. DEXA: none Pap smear: 04/2018 neg/neg Colonoscopy: 05/2013 repeat 10 yrs  Health Maintenance Due  Topic Date Due   HIV Screening  Never done   DTaP/Tdap/Td (1 - Tdap) Never done   Zoster Vaccines- Shingrix (1 of 2) Never done   COVID-19 Vaccine (3 - Moderna risk series) 04/17/2020    Immunization History  Administered Date(s) Administered   Influenza,inj,Quad PF,6+ Mos 08/28/2019   Moderna Sars-Covid-2 Vaccination 02/28/2020, 03/20/2020    Hypertension This is a chronic problem. The problem is controlled. Pertinent negatives include no chest pain, headaches, palpitations or shortness of breath. Past treatments include angiotensin blockers. The current treatment provides significant improvement. There is no history of kidney disease, CAD/MI or CVA.  Diabetes She presents for her follow-up diabetic visit. Diabetes type: prediabetes. Pertinent negatives for hypoglycemia include no dizziness, headaches, nervousness/anxiousness or tremors. Pertinent negatives for diabetes include no chest pain, no fatigue, no polydipsia and no polyuria. Pertinent negatives for diabetic complications include no CVA.    Lab Results  Component Value Date   NA 141 03/28/2021   K 4.4 03/28/2021   CO2 25 03/28/2021   GLUCOSE 120 (H) 03/28/2021   BUN 12 03/28/2021   CREATININE 0.66 03/28/2021   CALCIUM 10.0 03/28/2021   EGFR 102 03/28/2021   GFRNONAA 101 03/20/2020   Lab Results  Component Value Date   CHOL 222 (H) 03/28/2021   HDL 64 03/28/2021   LDLCALC 136 (H) 03/28/2021   TRIG 123 03/28/2021   CHOLHDL 3.5 03/28/2021   Lab Results  Component Value  Date   TSH 1.120 03/28/2021   Lab Results  Component Value Date   HGBA1C 6.5 (H) 03/28/2021   Lab Results  Component Value Date   WBC 5.8 03/28/2021   HGB 13.6 03/28/2021   HCT 40.3 03/28/2021   MCV 86 03/28/2021   PLT 233 03/28/2021   Lab Results  Component Value Date   ALT 27 03/28/2021   AST 22 03/28/2021   ALKPHOS 130 (H) 03/28/2021   BILITOT 0.4 03/28/2021   Lab Results  Component Value Date   VD25OH 33 03/07/2018     Review of Systems  Constitutional:  Negative for chills, fatigue and fever.  HENT:  Negative for congestion, hearing loss, tinnitus, trouble swallowing and voice change.   Eyes:  Negative for visual disturbance.  Respiratory:  Negative for cough, chest tightness, shortness of breath and wheezing.   Cardiovascular:  Negative for chest pain, palpitations and leg swelling.  Gastrointestinal:  Negative for abdominal pain, constipation, diarrhea and vomiting.  Endocrine: Negative for polydipsia and polyuria.  Genitourinary:  Negative for dysuria, frequency, genital sores, vaginal bleeding and vaginal discharge.  Musculoskeletal:  Negative for arthralgias, gait problem and joint swelling.  Skin:  Negative for color change and rash.  Neurological:  Negative for dizziness, tremors, light-headedness and headaches.  Hematological:  Negative for adenopathy. Does not bruise/bleed easily.  Psychiatric/Behavioral:  Negative for dysphoric mood and sleep disturbance. The patient is not nervous/anxious.     Patient Active Problem List   Diagnosis Date Noted   Prediabetes 08/28/2022  Mild hyperlipidemia 03/21/2020   Abnormal MRI of abdomen 09/06/2019   Essential hypertension 08/28/2019   Callus of foot 08/28/2019    Allergies  Allergen Reactions   Penicillins Hives    Past Surgical History:  Procedure Laterality Date   CHOLECYSTECTOMY  2019    Social History   Tobacco Use   Smoking status: Never   Smokeless tobacco: Never  Substance Use Topics    Alcohol use: Yes    Comment: occassional   Drug use: Never     Medication list has been reviewed and updated.  No outpatient medications have been marked as taking for the 08/28/22 encounter (Office Visit) with Glean Hess, MD.       08/28/2022    8:27 AM 10/07/2021    8:22 AM 03/28/2021    8:04 AM 10/08/2020   10:12 AM  GAD 7 : Generalized Anxiety Score  Nervous, Anxious, on Edge 0 0 0 0  Control/stop worrying 0 0 0 0  Worry too much - different things 0 0 0 0  Trouble relaxing 0 0 0 0  Restless 0 0 0 0  Easily annoyed or irritable 0 0 0 0  Afraid - awful might happen 0 0 0 0  Total GAD 7 Score 0 0 0 0  Anxiety Difficulty Not difficult at all Not difficult at all Not difficult at all        08/28/2022    8:27 AM 10/07/2021    8:22 AM 03/28/2021    8:04 AM  Depression screen PHQ 2/9  Decreased Interest 0 0 0  Down, Depressed, Hopeless 0 0 0  PHQ - 2 Score 0 0 0  Altered sleeping 0 0 0  Tired, decreased energy 0 0 0  Change in appetite 0 0 0  Feeling bad or failure about yourself  0 0 0  Trouble concentrating 0 0 0  Moving slowly or fidgety/restless 0 0 0  Suicidal thoughts 0 0 0  PHQ-9 Score 0 0 0  Difficult doing work/chores Not difficult at all Not difficult at all Not difficult at all    BP Readings from Last 3 Encounters:  08/28/22 136/86  10/07/21 136/80  03/28/21 112/70    Physical Exam Vitals and nursing note reviewed.  Constitutional:      General: She is not in acute distress.    Appearance: She is well-developed.  HENT:     Head: Normocephalic and atraumatic.     Right Ear: Tympanic membrane and ear canal normal.     Left Ear: Tympanic membrane and ear canal normal.     Nose:     Right Sinus: No maxillary sinus tenderness.     Left Sinus: No maxillary sinus tenderness.  Eyes:     General: No scleral icterus.       Right eye: No discharge.        Left eye: No discharge.     Conjunctiva/sclera: Conjunctivae normal.  Neck:     Thyroid: No  thyromegaly.     Vascular: No carotid bruit.  Cardiovascular:     Rate and Rhythm: Normal rate and regular rhythm.     Pulses: Normal pulses.     Heart sounds: Normal heart sounds.  Pulmonary:     Effort: Pulmonary effort is normal. No respiratory distress.     Breath sounds: No wheezing.  Chest:  Breasts:    Right: No mass, nipple discharge, skin change or tenderness.     Left: No mass, nipple  discharge, skin change or tenderness.  Abdominal:     General: Bowel sounds are normal.     Palpations: Abdomen is soft.     Tenderness: There is no abdominal tenderness.  Musculoskeletal:     Cervical back: Normal range of motion. No erythema.     Right lower leg: No edema.     Left lower leg: No edema.  Lymphadenopathy:     Cervical: No cervical adenopathy.  Skin:    General: Skin is warm and dry.     Findings: No rash.  Neurological:     Mental Status: She is alert and oriented to person, place, and time.     Cranial Nerves: No cranial nerve deficit.     Sensory: No sensory deficit.     Deep Tendon Reflexes: Reflexes are normal and symmetric.  Psychiatric:        Attention and Perception: Attention normal.        Mood and Affect: Mood normal.    Diabetic Foot Exam - Simple   Simple Foot Form Diabetic Foot exam was performed with the following findings: Yes 08/28/2022  8:45 AM  Visual Inspection No deformities, no ulcerations, no other skin breakdown bilaterally: Yes Sensation Testing Intact to touch and monofilament testing bilaterally: Yes Pulse Check Posterior Tibialis and Dorsalis pulse intact bilaterally: Yes Comments Callous right sole      Wt Readings from Last 3 Encounters:  08/28/22 163 lb 6.4 oz (74.1 kg)  10/07/21 159 lb (72.1 kg)  03/28/21 158 lb (71.7 kg)    BP 136/86 (BP Location: Right Arm, Cuff Size: Normal)   Pulse 82   Ht 5' 2"$  (1.575 m)   Wt 163 lb 6.4 oz (74.1 kg)   LMP  (LMP Unknown) Comment: menopause age 37  SpO2 98%   BMI 29.89 kg/m    Assessment and Plan: Problem List Items Addressed This Visit       Cardiovascular and Mediastinum   Essential hypertension (Chronic)    Clinically stable exam with elevated BP on amlodipine. Tolerating medications without side effects. Continue to limit caffeine and sodium Increase losartan to 50 mg       Relevant Medications   losartan (COZAAR) 50 MG tablet   Other Relevant Orders   CBC with Differential/Platelet   Comprehensive metabolic panel   TSH     Other   Mild hyperlipidemia    Currently diet control only Last LDL 136      Relevant Medications   losartan (COZAAR) 50 MG tablet   Other Relevant Orders   Lipid panel   Prediabetes (Chronic)    Last A1C 6.5 No medications prescribed but encouraged ongoing diet changes and weight loss      Relevant Orders   Hemoglobin A1c   Microalbumin / creatinine urine ratio   Other Visit Diagnoses     Annual physical exam    -  Primary   Relevant Orders   CBC with Differential/Platelet   Comprehensive metabolic panel   Hemoglobin A1c   Lipid panel   TSH   Abnormal mammogram of right breast       Relevant Orders   MM DIAG BREAST TOMO UNI RIGHT   Colon cancer screening       Relevant Orders   Ambulatory referral to Gastroenterology        Partially dictated using Dragon software. Any errors are unintentional.  Halina Maidens, MD [facility]  08/28/2022

## 2022-08-28 NOTE — Assessment & Plan Note (Signed)
Currently diet control only Last LDL 136

## 2022-08-28 NOTE — Assessment & Plan Note (Addendum)
Clinically stable exam with elevated BP on amlodipine. Tolerating medications without side effects. Continue to limit caffeine and sodium Increase losartan to 50 mg

## 2022-08-31 LAB — CBC WITH DIFFERENTIAL/PLATELET
Basophils Absolute: 0.1 10*3/uL (ref 0.0–0.2)
Basos: 1 %
EOS (ABSOLUTE): 0.1 10*3/uL (ref 0.0–0.4)
Eos: 2 %
Hematocrit: 38.2 % (ref 34.0–46.6)
Hemoglobin: 13 g/dL (ref 11.1–15.9)
Immature Grans (Abs): 0 10*3/uL (ref 0.0–0.1)
Immature Granulocytes: 0 %
Lymphocytes Absolute: 2.2 10*3/uL (ref 0.7–3.1)
Lymphs: 35 %
MCH: 29.6 pg (ref 26.6–33.0)
MCHC: 34 g/dL (ref 31.5–35.7)
MCV: 87 fL (ref 79–97)
Monocytes Absolute: 0.4 10*3/uL (ref 0.1–0.9)
Monocytes: 6 %
Neutrophils Absolute: 3.6 10*3/uL (ref 1.4–7.0)
Neutrophils: 56 %
Platelets: 258 10*3/uL (ref 150–450)
RBC: 4.39 x10E6/uL (ref 3.77–5.28)
RDW: 13.3 % (ref 11.7–15.4)
WBC: 6.4 10*3/uL (ref 3.4–10.8)

## 2022-08-31 LAB — COMPREHENSIVE METABOLIC PANEL
ALT: 14 IU/L (ref 0–32)
AST: 15 IU/L (ref 0–40)
Albumin/Globulin Ratio: 2 (ref 1.2–2.2)
Albumin: 4.6 g/dL (ref 3.8–4.9)
Alkaline Phosphatase: 129 IU/L — ABNORMAL HIGH (ref 44–121)
BUN/Creatinine Ratio: 16 (ref 12–28)
BUN: 10 mg/dL (ref 8–27)
Bilirubin Total: 0.3 mg/dL (ref 0.0–1.2)
CO2: 24 mmol/L (ref 20–29)
Calcium: 9.7 mg/dL (ref 8.7–10.3)
Chloride: 102 mmol/L (ref 96–106)
Creatinine, Ser: 0.63 mg/dL (ref 0.57–1.00)
Globulin, Total: 2.3 g/dL (ref 1.5–4.5)
Glucose: 123 mg/dL — ABNORMAL HIGH (ref 70–99)
Potassium: 4.3 mmol/L (ref 3.5–5.2)
Sodium: 141 mmol/L (ref 134–144)
Total Protein: 6.9 g/dL (ref 6.0–8.5)
eGFR: 101 mL/min/{1.73_m2} (ref >59)

## 2022-08-31 LAB — MICROALBUMIN / CREATININE URINE RATIO
Creatinine, Urine: 13 mg/dL
Microalb/Creat Ratio: <23 mg/g creat (ref 0–29)
Microalbumin, Urine: <3 ug/mL

## 2022-08-31 LAB — LIPID PANEL
Chol/HDL Ratio: 3.6 ratio (ref 0.0–4.4)
Cholesterol, Total: 193 mg/dL (ref 100–199)
HDL: 54 mg/dL (ref >39)
LDL Chol Calc (NIH): 122 mg/dL — ABNORMAL HIGH (ref 0–99)
Triglycerides: 94 mg/dL (ref 0–149)
VLDL Cholesterol Cal: 17 mg/dL (ref 5–40)

## 2022-08-31 LAB — HEMOGLOBIN A1C
Est. average glucose Bld gHb Est-mCnc: 148 mg/dL
Hgb A1c MFr Bld: 6.8 % — ABNORMAL HIGH (ref 4.8–5.6)

## 2022-08-31 LAB — TSH: TSH: 1.58 u[IU]/mL (ref 0.450–4.500)

## 2022-09-01 ENCOUNTER — Telehealth: Payer: Self-pay | Admitting: Internal Medicine

## 2022-09-01 ENCOUNTER — Other Ambulatory Visit: Payer: Self-pay | Admitting: Internal Medicine

## 2022-09-01 DIAGNOSIS — R7303 Prediabetes: Secondary | ICD-10-CM

## 2022-09-01 MED ORDER — METFORMIN HCL ER 500 MG PO TB24: 500.0000 mg | ORAL_TABLET | Freq: Every day | ORAL | 1 refills | Status: DC

## 2022-09-01 NOTE — Telephone Encounter (Signed)
Pt. Reports she is not going to take Metformin at this time. Will try lifestyle chagnes and follow up.Marland Kitchen

## 2022-09-03 ENCOUNTER — Telehealth: Payer: Self-pay

## 2022-09-03 ENCOUNTER — Other Ambulatory Visit: Payer: Self-pay

## 2022-09-03 DIAGNOSIS — Z1211 Encounter for screening for malignant neoplasm of colon: Secondary | ICD-10-CM

## 2022-09-03 MED ORDER — GOLYTELY 236 G PO SOLR: 4000.0000 mL | Freq: Once | ORAL | 0 refills | Status: AC

## 2022-09-03 NOTE — Telephone Encounter (Signed)
Patient calling to schedule colonoscopy.

## 2022-09-03 NOTE — Telephone Encounter (Signed)
Gastroenterology Pre-Procedure Review  Request Date: 09/14/22 Requesting Physician: Dr. Allen Norris  PATIENT REVIEW QUESTIONS: The patient responded to the following health history questions as indicated:    1. Are you having any GI issues? no 2. Do you have a personal history of Polyps? no 3. Do you have a family history of Colon Cancer or Polyps? no 4. Diabetes Mellitus? no 5. Joint replacements in the past 12 months?no 6. Major health problems in the past 3 months?no 7. Any artificial heart valves, MVP, or defibrillator?no    MEDICATIONS & ALLERGIES:    Patient reports the following regarding taking any anticoagulation/antiplatelet therapy:   Plavix, Coumadin, Eliquis, Xarelto, Lovenox, Pradaxa, Brilinta, or Effient? no Aspirin? no  Patient confirms/reports the following medications:  Current Outpatient Medications  Medication Sig Dispense Refill   Chlorpheniramine Maleate (ALLERGY PO) Take by mouth as needed.     losartan (COZAAR) 50 MG tablet Take 1 tablet (50 mg total) by mouth daily. TAKE 1 TABLET(25 MG) BY MOUTH DAILY 90 tablet 1   metFORMIN (GLUCOPHAGE-XR) 500 MG 24 hr tablet Take 1 tablet (500 mg total) by mouth daily with breakfast. (Patient not taking: Reported on 09/03/2022) 90 tablet 1   No current facility-administered medications for this visit.    Patient confirms/reports the following allergies:  Allergies  Allergen Reactions   Penicillins Hives    No orders of the defined types were placed in this encounter.   AUTHORIZATION INFORMATION Primary Insurance: 1D#: Group #:  Secondary Insurance: 1D#: Group #:  SCHEDULE INFORMATION: Date: 09/14/22 Time: Location: [place]

## 2022-09-08 ENCOUNTER — Encounter: Payer: Self-pay | Admitting: Internal Medicine

## 2022-09-08 ENCOUNTER — Other Ambulatory Visit: Payer: Self-pay | Admitting: Internal Medicine

## 2022-09-08 DIAGNOSIS — I10 Essential (primary) hypertension: Secondary | ICD-10-CM

## 2022-09-14 ENCOUNTER — Ambulatory Visit: Admission: RE | Admit: 2022-09-14 | Source: Home / Self Care | Admitting: Gastroenterology

## 2022-09-14 ENCOUNTER — Encounter: Admission: RE | Payer: Self-pay | Source: Home / Self Care

## 2022-09-14 SURGERY — COLONOSCOPY WITH PROPOFOL
Anesthesia: Choice

## 2022-10-12 ENCOUNTER — Other Ambulatory Visit: Payer: Self-pay

## 2022-10-12 ENCOUNTER — Telehealth: Payer: Self-pay

## 2022-10-12 DIAGNOSIS — Z1211 Encounter for screening for malignant neoplasm of colon: Secondary | ICD-10-CM

## 2022-10-12 NOTE — Progress Notes (Signed)
Returned patients call to reschedule her colonoscopy from Feb with Dr. Allen Norris.  Colonoscopy has been scheduled with Dr. Allen Norris 11/10/22 at [facility].

## 2022-10-12 NOTE — Telephone Encounter (Signed)
Pt calling back to reschedule her colonoscopy. She had one scheduled in February and had to cancel. Please return call to set up.

## 2022-10-13 NOTE — Telephone Encounter (Signed)
Patient Jenny Gordon was returned yesterday.  Her colonoscopy has been scheduled for 11/10/22 at [facility] with Dr. Allen Norris.  Thanks, [place], [place]

## 2022-10-14 ENCOUNTER — Ambulatory Visit: Admitting: Family Medicine

## 2022-10-14 ENCOUNTER — Encounter: Payer: Self-pay | Admitting: Family Medicine

## 2022-10-14 VITALS — BP 118/80 | HR 74 | Ht 62.0 in | Wt 164.0 lb

## 2022-10-14 DIAGNOSIS — J00 Acute nasopharyngitis [common cold]: Secondary | ICD-10-CM | POA: Diagnosis not present

## 2022-10-14 LAB — POC COVID19 BINAXNOW: SARS Coronavirus 2 Ag: NEGATIVE

## 2022-10-14 LAB — POCT INFLUENZA A/B
Influenza A, POC: NEGATIVE
Influenza B, POC: NEGATIVE

## 2022-10-14 MED ORDER — PROMETHAZINE-DM 6.25-15 MG/5ML PO SYRP: 5.0000 mL | ORAL_SOLUTION | Freq: Four times a day (QID) | ORAL | 0 refills | Status: DC | PRN

## 2022-10-14 NOTE — Patient Instructions (Signed)
-   Start Flonase pulses fluticasone propionate), 2 sprays each nostril daily x 5 days then as needed - Take Mucinex 12-hour (guaifenesin 12) twice daily x 5 days and as needed - Can use Rx cough medicine as needed - Contact our office in 2-5 days for any worsening symptoms or failure to improve - Otherwise follow-up as needed

## 2022-10-14 NOTE — Assessment & Plan Note (Addendum)
5-day history of symptoms initially thought to be related to allergies, low-grade fevers x 1 day, has been using OTC allergy regimens.  Reports nighttime cough symptoms are the worst, denies shortness of air, has had sick contacts 2 weeks prior.  Examination with mild erythematous turbinates with swelling, nontender sinuses, oropharynx slightly injected, no exudates, tympanic membranes and canals benign, benign cardiac and pulmonary sounds.  Flu and COVID point-of-care test negative.  Given the clinical course, findings, plan as follows: - Flonase, Mucinex 12-hour x 5 days -Promethazine/Phenergan as needed - Contact our office in 2-5 days for any worsening symptoms/failure to progress, at which point antibiotics to be considered.

## 2022-10-14 NOTE — Progress Notes (Signed)
     Primary Care / Sports Medicine Office Visit  Patient Information:  Patient ID: Jenny Gordon, female DOB: 1961/03/02 Age: 62 y.o. MRN: [id number]   Jenny Gordon is a pleasant 62 y.o. female presenting with the following:  Chief Complaint  Patient presents with   Nasal Congestion    5 days, low grade fever for 1 day.     Vitals:   10/14/22 0937  BP: 118/80  Pulse: 74  SpO2: 98%   Vitals:   10/14/22 0937  Weight: 164 lb (74.4 kg)  Height: 5\' 2"  (1.575 m)   Body mass index is 30 kg/m.  No results found.   Independent interpretation of notes and tests performed by another provider:   None  Procedures performed:   None  Pertinent History, Exam, Impression, and Recommendations:   Jenny Gordon was seen today for nasal congestion.  Acute rhinitis Assessment & Plan: 5-day history of symptoms initially thought to be related to allergies, low-grade fevers x 1 day, has been using OTC allergy regimens.  Reports nighttime cough symptoms are the worst, denies shortness of air, has had sick contacts 2 weeks prior.  Examination with mild erythematous turbinates with swelling, nontender sinuses, oropharynx slightly injected, no exudates, tympanic membranes and canals benign, benign cardiac and pulmonary sounds.  Flu and COVID point-of-care test negative.  Given the clinical course, findings, plan as follows: - Flonase, Mucinex 12-hour x 5 days -Promethazine/Phenergan as needed - Contact our office in 2-5 days for any worsening symptoms/failure to progress, at which point antibiotics to be considered.  Orders: -     POCT Influenza A/B -     POC COVID-19 BinaxNow  Other orders -     Promethazine-DM; Take 5 mLs by mouth 4 (four) times daily as needed for cough.  Dispense: 118 mL; Refill: 0     Orders & Medications Meds ordered this encounter  Medications   promethazine-dextromethorphan (PROMETHAZINE-DM) 6.25-15 MG/5ML syrup    Sig: Take 5 mLs by mouth 4 (four) times daily as  needed for cough.    Dispense:  118 mL    Refill:  0   Orders Placed This Encounter  Procedures   POCT Influenza A/B   POC COVID-19     No follow-ups on file.     Montel Culver, MD, [facility]   Primary Care Sports Medicine Primary Care and Sports Medicine at [facility]

## 2022-10-27 ENCOUNTER — Telehealth: Payer: Self-pay | Admitting: Gastroenterology

## 2022-10-27 NOTE — Telephone Encounter (Signed)
Pt called to cancel colonoscopy  for 11/10/2022 will call back and reschedule

## 2022-11-10 ENCOUNTER — Ambulatory Visit: Admit: 2022-11-10 | Admitting: Gastroenterology

## 2022-11-10 SURGERY — COLONOSCOPY WITH PROPOFOL
Anesthesia: General

## 2022-12-01 ENCOUNTER — Ambulatory Visit: Admitting: Internal Medicine

## 2022-12-01 ENCOUNTER — Encounter: Payer: Self-pay | Admitting: Internal Medicine

## 2022-12-01 VITALS — BP 126/70 | HR 89 | Temp 98.1°F | Ht 62.0 in | Wt 160.6 lb

## 2022-12-01 DIAGNOSIS — R058 Other specified cough: Secondary | ICD-10-CM

## 2022-12-01 NOTE — Progress Notes (Signed)
Date:  12/01/2022   Name:  Jenny Gordon   DOB:  1960/12/21   MRN:  [id number]   Chief Complaint: Cough  Cough This is a new problem. Episode onset: 2.5 weeks. The problem has been waxing and waning. The cough is Productive of sputum. Associated symptoms include postnasal drip and wheezing. Pertinent negatives include no chest pain, chills, fever, headaches, nasal congestion, sore throat or shortness of breath. Rash: clear production.   Lab Results  Component Value Date   NA 141 08/28/2022   K 4.3 08/28/2022   CO2 24 08/28/2022   GLUCOSE 123 (H) 08/28/2022   BUN 10 08/28/2022   CREATININE 0.63 08/28/2022   CALCIUM 9.7 08/28/2022   EGFR 101 08/28/2022   GFRNONAA 101 03/20/2020   Lab Results  Component Value Date   CHOL 193 08/28/2022   HDL 54 08/28/2022   LDLCALC 122 (H) 08/28/2022   TRIG 94 08/28/2022   CHOLHDL 3.6 08/28/2022   Lab Results  Component Value Date   TSH 1.580 08/28/2022   Lab Results  Component Value Date   HGBA1C 6.8 (H) 08/28/2022   Lab Results  Component Value Date   WBC 6.4 08/28/2022   HGB 13.0 08/28/2022   HCT 38.2 08/28/2022   MCV 87 08/28/2022   PLT 258 08/28/2022   Lab Results  Component Value Date   ALT 14 08/28/2022   AST 15 08/28/2022   ALKPHOS 129 (H) 08/28/2022   BILITOT 0.3 08/28/2022   Lab Results  Component Value Date   VD25OH 33 03/07/2018     Review of Systems  Constitutional:  Negative for chills, fatigue and fever.  HENT:  Positive for postnasal drip. Negative for congestion, sinus pain, sore throat and trouble swallowing.   Respiratory:  Positive for cough and wheezing. Negative for shortness of breath.   Cardiovascular:  Negative for chest pain and palpitations.  Skin:  Rash: clear production.  Neurological:  Negative for dizziness, light-headedness and headaches.  Psychiatric/Behavioral:  Negative for dysphoric mood and sleep disturbance. The patient is not nervous/anxious.     Patient Active Problem List    Diagnosis Date Noted   Acute rhinitis 10/14/2022   Prediabetes 08/28/2022   Mild hyperlipidemia 03/21/2020   Abnormal MRI of abdomen 09/06/2019   Essential hypertension 08/28/2019   Callus of foot 08/28/2019    Allergies  Allergen Reactions   Penicillins Hives    Past Surgical History:  Procedure Laterality Date   CHOLECYSTECTOMY  2019    Social History   Tobacco Use   Smoking status: Never   Smokeless tobacco: Never  Substance Use Topics   Alcohol use: Yes    Comment: occassional   Drug use: Never     Medication list has been reviewed and updated.  Current Meds  Medication Sig   Chlorpheniramine Maleate (ALLERGY PO) Take by mouth as needed.   losartan (COZAAR) 50 MG tablet Take 1 tablet (50 mg total) by mouth daily. TAKE 1 TABLET(25 MG) BY MOUTH DAILY       12/01/2022   10:30 AM 08/28/2022    8:27 AM 10/07/2021    8:22 AM 03/28/2021    8:04 AM  GAD 7 : Generalized Anxiety Score  Nervous, Anxious, on Edge 0 0 0 0  Control/stop worrying 0 0 0 0  Worry too much - different things 0 0 0 0  Trouble relaxing 0 0 0 0  Restless 0 0 0 0  Easily annoyed or irritable 0 0 0 0  Afraid - awful might happen 0 0 0 0  Total GAD 7 Score 0 0 0 0  Anxiety Difficulty Not difficult at all Not difficult at all Not difficult at all Not difficult at all       12/01/2022   10:30 AM 08/28/2022    8:27 AM 10/07/2021    8:22 AM  Depression screen PHQ 2/9  Decreased Interest 0 0 0  Down, Depressed, Hopeless 0 0 0  PHQ - 2 Score 0 0 0  Altered sleeping 0 0 0  Tired, decreased energy 1 0 0  Change in appetite 0 0 0  Feeling bad or failure about yourself  0 0 0  Trouble concentrating 0 0 0  Moving slowly or fidgety/restless 0 0 0  Suicidal thoughts 0 0 0  PHQ-9 Score 1 0 0  Difficult doing work/chores Not difficult at all Not difficult at all Not difficult at all    BP Readings from Last 3 Encounters:  12/01/22 126/70  10/14/22 118/80  08/28/22 136/86    Physical Exam Vitals  and nursing note reviewed.  Constitutional:      General: She is not in acute distress.    Appearance: Normal appearance. She is well-developed.  HENT:     Head: Normocephalic and atraumatic.     Right Ear: Tympanic membrane normal.     Left Ear: Tympanic membrane normal.     Nose:     Right Sinus: No maxillary sinus tenderness or frontal sinus tenderness.     Left Sinus: No maxillary sinus tenderness or frontal sinus tenderness.  Cardiovascular:     Rate and Rhythm: Normal rate and regular rhythm.  Pulmonary:     Effort: Pulmonary effort is normal. No respiratory distress.     Breath sounds: No wheezing or rhonchi.  Musculoskeletal:     Cervical back: Normal range of motion.  Skin:    General: Skin is warm and dry.     Findings: No rash.  Neurological:     General: No focal deficit present.     Mental Status: She is alert and oriented to person, place, and time.  Psychiatric:        Mood and Affect: Mood normal.        Behavior: Behavior normal.     Wt Readings from Last 3 Encounters:  12/01/22 160 lb 9.6 oz (72.8 kg)  10/14/22 164 lb (74.4 kg)  08/28/22 163 lb 6.4 oz (74.1 kg)    BP 126/70   Pulse 89   Temp 98.1 F (36.7 C) (Oral)   Ht 5\' 2"  (1.575 m)   Wt 160 lb 9.6 oz (72.8 kg)   LMP  (LMP Unknown) Comment: menopause age 14  SpO2 97%   BMI 29.37 kg/m   Assessment and Plan:  Problem List Items Addressed This Visit   None Visit Diagnoses     Allergic cough    -  Primary   Zyrtec and Nasacort spray daily No indication for antibiotics at this time       No follow-ups on file.   Partially dictated using Dragon software, any errors are not intentional.  Reubin Milan, MD [facility] Primary Care and Sports Medicine [place], [place]

## 2022-12-01 NOTE — Patient Instructions (Signed)
Take Allegra, Zyrtec or Claritin - one of these once daily.  Flonase or Nasacort nasal spray daily.

## 2022-12-17 ENCOUNTER — Ambulatory Visit
Admission: RE | Admit: 2022-12-17 | Discharge: 2022-12-17 | Disposition: A | Discharge status: Home / Self Care | Source: Ambulatory Visit | Attending: Internal Medicine | Admitting: Internal Medicine

## 2022-12-17 DIAGNOSIS — R928 Other abnormal and inconclusive findings on diagnostic imaging of breast: Secondary | ICD-10-CM | POA: Insufficient documentation

## 2023-01-11 ENCOUNTER — Ambulatory Visit: Admitting: Internal Medicine

## 2023-01-11 ENCOUNTER — Encounter: Payer: Self-pay | Admitting: Internal Medicine

## 2023-01-11 VITALS — BP 138/80 | HR 64 | Ht 62.0 in | Wt 164.2 lb

## 2023-01-11 DIAGNOSIS — I10 Essential (primary) hypertension: Secondary | ICD-10-CM | POA: Diagnosis not present

## 2023-01-11 DIAGNOSIS — R7303 Prediabetes: Secondary | ICD-10-CM | POA: Diagnosis not present

## 2023-01-11 LAB — POCT GLYCOSYLATED HEMOGLOBIN (HGB A1C): Hemoglobin A1C: 7.1 % — AB (ref 4.0–5.6)

## 2023-01-11 MED ORDER — LOSARTAN POTASSIUM 50 MG PO TABS: 50.0000 mg | ORAL_TABLET | Freq: Every day | ORAL | 1 refills | Status: DC

## 2023-01-11 NOTE — Assessment & Plan Note (Signed)
Stable exam with well controlled BP.  Currently taking losartan. Tolerating medications without concerns or side effects. Will continue to recommend low sodium diet and current regimen.  

## 2023-01-11 NOTE — Assessment & Plan Note (Addendum)
Blood sugars managed with diet and exercise.  Metformin stated in February but pt discontinued in March. Lab Results  Component Value Date   HGBA1C 6.8 (H) 08/28/2022  A1C today = 7.1. She does not want to take medications; will work harder on exercise and aim for 5-10 lb weight loss.

## 2023-01-11 NOTE — Progress Notes (Signed)
Date:  01/11/2023   Name:  Jenny Gordon   DOB:  11-20-1960   MRN:  [id number]   Chief Complaint: Diabetes and Hypertension  Hypertension This is a chronic problem. The problem is controlled. Pertinent negatives include no chest pain, headaches, palpitations or shortness of breath. Past treatments include angiotensin blockers. The current treatment provides significant improvement.  Diabetes She presents for her follow-up diabetic visit. She has type 2 diabetes mellitus. Her disease course has been stable. Pertinent negatives for hypoglycemia include no dizziness or headaches. Pertinent negatives for diabetes include no chest pain, no fatigue and no weakness. Current diabetic treatment includes diet. She is compliant with treatment most of the time.    Lab Results  Component Value Date   NA 141 08/28/2022   K 4.3 08/28/2022   CO2 24 08/28/2022   GLUCOSE 123 (H) 08/28/2022   BUN 10 08/28/2022   CREATININE 0.63 08/28/2022   CALCIUM 9.7 08/28/2022   EGFR 101 08/28/2022   GFRNONAA 101 03/20/2020   Lab Results  Component Value Date   CHOL 193 08/28/2022   HDL 54 08/28/2022   LDLCALC 122 (H) 08/28/2022   TRIG 94 08/28/2022   CHOLHDL 3.6 08/28/2022   Lab Results  Component Value Date   TSH 1.580 08/28/2022   Lab Results  Component Value Date   HGBA1C 7.1 (A) 01/11/2023   Lab Results  Component Value Date   WBC 6.4 08/28/2022   HGB 13.0 08/28/2022   HCT 38.2 08/28/2022   MCV 87 08/28/2022   PLT 258 08/28/2022   Lab Results  Component Value Date   ALT 14 08/28/2022   AST 15 08/28/2022   ALKPHOS 129 (H) 08/28/2022   BILITOT 0.3 08/28/2022   Lab Results  Component Value Date   VD25OH 33 03/07/2018     Review of Systems  Constitutional:  Negative for fatigue and unexpected weight change.  HENT:  Negative for nosebleeds.   Eyes:  Negative for visual disturbance.  Respiratory:  Negative for cough, chest tightness, shortness of breath and wheezing.    Cardiovascular:  Negative for chest pain, palpitations and leg swelling.  Gastrointestinal:  Negative for abdominal pain, constipation and diarrhea.  Musculoskeletal:  Positive for arthralgias (left foot callous).  Neurological:  Negative for dizziness, weakness, light-headedness and headaches.    Patient Active Problem List   Diagnosis Date Noted   Acute rhinitis 10/14/2022   Prediabetes 08/28/2022   Mild hyperlipidemia 03/21/2020   Abnormal MRI of abdomen 09/06/2019   Essential hypertension 08/28/2019   Callus of foot 08/28/2019    Allergies  Allergen Reactions   Penicillins Hives    Past Surgical History:  Procedure Laterality Date   CHOLECYSTECTOMY  2019    Social History   Tobacco Use   Smoking status: Never   Smokeless tobacco: Never  Substance Use Topics   Alcohol use: Yes    Comment: occassional   Drug use: Never     Medication list has been reviewed and updated.  Current Meds  Medication Sig   Chlorpheniramine Maleate (ALLERGY PO) Take by mouth as needed.   [DISCONTINUED] losartan (COZAAR) 50 MG tablet Take 1 tablet (50 mg total) by mouth daily. TAKE 1 TABLET(25 MG) BY MOUTH DAILY       01/11/2023    9:19 AM 12/01/2022   10:30 AM 08/28/2022    8:27 AM 10/07/2021    8:22 AM  GAD 7 : Generalized Anxiety Score  Nervous, Anxious, on Edge 0 0 0  0  Control/stop worrying 0 0 0 0  Worry too much - different things 1 0 0 0  Trouble relaxing 0 0 0 0  Restless 0 0 0 0  Easily annoyed or irritable 0 0 0 0  Afraid - awful might happen 0 0 0 0  Total GAD 7 Score 1 0 0 0  Anxiety Difficulty Not difficult at all Not difficult at all Not difficult at all Not difficult at all       01/11/2023    9:19 AM 12/01/2022   10:30 AM 08/28/2022    8:27 AM  Depression screen PHQ 2/9  Decreased Interest 0 0 0  Down, Depressed, Hopeless 0 0 0  PHQ - 2 Score 0 0 0  Altered sleeping 0 0 0  Tired, decreased energy 1 1 0  Change in appetite 0 0 0  Feeling bad or failure  about yourself  0 0 0  Trouble concentrating 0 0 0  Moving slowly or fidgety/restless 0 0 0  Suicidal thoughts 0 0 0  PHQ-9 Score 1 1 0  Difficult doing work/chores Not difficult at all Not difficult at all Not difficult at all    BP Readings from Last 3 Encounters:  01/11/23 138/80  12/01/22 126/70  10/14/22 118/80    Physical Exam Vitals and nursing note reviewed.  Constitutional:      General: She is not in acute distress.    Appearance: She is well-developed.  HENT:     Head: Normocephalic and atraumatic.  Neck:     Vascular: No carotid bruit.  Cardiovascular:     Rate and Rhythm: Normal rate and regular rhythm.  Pulmonary:     Effort: Pulmonary effort is normal. No respiratory distress.     Breath sounds: No wheezing or rhonchi.  Musculoskeletal:     Cervical back: Normal range of motion.     Right lower leg: No edema.     Left lower leg: No edema.  Lymphadenopathy:     Cervical: No cervical adenopathy.  Skin:    General: Skin is warm and dry.     Findings: No rash.  Neurological:     Mental Status: She is alert and oriented to person, place, and time.  Psychiatric:        Mood and Affect: Mood normal.        Behavior: Behavior normal.    Diabetic Foot Exam - Simple   Simple Foot Form Diabetic Foot exam was performed with the following findings: Yes 01/11/2023  9:31 AM  Visual Inspection No deformities, no ulcerations, no other skin breakdown bilaterally: Yes Sensation Testing Intact to touch and monofilament testing bilaterally: Yes Pulse Check Posterior Tibialis and Dorsalis pulse intact bilaterally: Yes Comments      Wt Readings from Last 3 Encounters:  01/11/23 164 lb 3.2 oz (74.5 kg)  12/01/22 160 lb 9.6 oz (72.8 kg)  10/14/22 164 lb (74.4 kg)    BP 138/80   Pulse 64   Ht 5\' 2"  (1.575 m)   Wt 164 lb 3.2 oz (74.5 kg)   LMP  (LMP Unknown) Comment: menopause age 62  SpO2 98%   BMI 30.03 kg/m   Assessment and Plan:  Problem List Items  Addressed This Visit     Prediabetes (Chronic)    Blood sugars managed with diet and exercise.  Metformin stated in February but pt discontinued in March. Lab Results  Component Value Date   HGBA1C 6.8 (H) 08/28/2022  A1C today =  7.1. She does not want to take medications; will work harder on exercise and aim for 5-10 lb weight loss.      Relevant Orders   POCT glycosylated hemoglobin (Hb A1C) (Completed)   Essential hypertension - Primary (Chronic)    Stable exam with well controlled BP.  Currently taking losartan. Tolerating medications without concerns or side effects. Will continue to recommend low sodium diet and current regimen.       Relevant Medications   losartan (COZAAR) 50 MG tablet    Return in about 3 months (around 04/13/2023) for DM, HTN.   Partially dictated using Dragon software, any errors are not intentional.  Reubin Milan, MD [facility] [place], [place]

## 2023-02-04 ENCOUNTER — Other Ambulatory Visit: Payer: Self-pay | Admitting: Internal Medicine

## 2023-02-04 DIAGNOSIS — I10 Essential (primary) hypertension: Secondary | ICD-10-CM

## 2023-02-05 NOTE — Telephone Encounter (Signed)
Requested Prescriptions  Refused Prescriptions Disp Refills   losartan (COZAAR) 50 MG tablet [Pharmacy Med Name: LOSARTAN 50MG  TABLETS] 90 tablet 1    Sig: TAKE 1 TABLET BY MOUTH EVERY DAY     Cardiovascular:  Angiotensin Receptor Blockers Passed - 02/04/2023  8:13 AM      Passed - Cr in normal range and within 180 days    Creatinine, Ser  Date Value Ref Range Status  08/28/2022 0.63 0.57 - 1.00 mg/dL Final         Passed - K in normal range and within 180 days    Potassium  Date Value Ref Range Status  08/28/2022 4.3 3.5 - 5.2 mmol/L Final         Passed - Patient is not pregnant      Passed - Last BP in normal range    BP Readings from Last 1 Encounters:  01/11/23 138/80         Passed - Valid encounter within last 6 months    Recent Outpatient Visits           3 weeks ago Essential hypertension   [place] [facility] at [facility], Nyoka Cowden, MD   2 months ago Allergic cough   [place] [facility] at [facility], Nyoka Cowden, MD   3 months ago Acute rhinitis   [place] [facility] at [facility], Ocie Bob, MD   5 months ago Annual physical exam   [facility] at [facility], Nyoka Cowden, MD   1 year ago Essential hypertension   [place] [facility] at [facility], Nyoka Cowden, MD       Future Appointments             In 2 months Judithann Graves, Nyoka Cowden, MD [facility] Medicine at [facility], [facility]

## 2023-04-13 ENCOUNTER — Ambulatory Visit: Admitting: Internal Medicine

## 2023-04-13 ENCOUNTER — Encounter: Payer: Self-pay | Admitting: Internal Medicine

## 2023-04-13 VITALS — BP 127/78 | HR 73 | Ht 62.0 in | Wt 165.8 lb

## 2023-04-13 DIAGNOSIS — R7303 Prediabetes: Secondary | ICD-10-CM

## 2023-04-13 DIAGNOSIS — I10 Essential (primary) hypertension: Secondary | ICD-10-CM

## 2023-04-13 LAB — POCT GLYCOSYLATED HEMOGLOBIN (HGB A1C): Hemoglobin A1C: 6.6 % — AB (ref 4.0–5.6)

## 2023-04-13 NOTE — Assessment & Plan Note (Addendum)
A1C higher last visit at 7.1 Re-enforced diet, exercise and weight loss in lieu of medication. A1C today = 6.6 which is much improved despite no weight loss. She will continue lifestyle changes for now.

## 2023-04-13 NOTE — Assessment & Plan Note (Signed)
BP controlled on losartan, along with diet and lifestyle changes.

## 2023-04-13 NOTE — Progress Notes (Signed)
Date:  04/13/2023   Name:  Jenny Gordon   DOB:  07/24/1960   MRN:  [id number]   Chief Complaint: Diabetes and Hypertension  Diabetes She presents for her follow-up diabetic visit. Diabetes type: prediabetes ----- type 2. Pertinent negatives for hypoglycemia include no headaches or tremors. Pertinent negatives for diabetes include no chest pain, no fatigue, no polydipsia and no polyuria.  Hypertension This is a chronic problem. The problem is controlled. Pertinent negatives include no chest pain, headaches, palpitations or shortness of breath. Past treatments include angiotensin blockers.    Lab Results  Component Value Date   NA 141 08/28/2022   K 4.3 08/28/2022   CO2 24 08/28/2022   GLUCOSE 123 (H) 08/28/2022   BUN 10 08/28/2022   CREATININE 0.63 08/28/2022   CALCIUM 9.7 08/28/2022   EGFR 101 08/28/2022   GFRNONAA 101 03/20/2020   Lab Results  Component Value Date   CHOL 193 08/28/2022   HDL 54 08/28/2022   LDLCALC 122 (H) 08/28/2022   TRIG 94 08/28/2022   CHOLHDL 3.6 08/28/2022   Lab Results  Component Value Date   TSH 1.580 08/28/2022   Lab Results  Component Value Date   HGBA1C 6.6 (A) 04/13/2023   Lab Results  Component Value Date   WBC 6.4 08/28/2022   HGB 13.0 08/28/2022   HCT 38.2 08/28/2022   MCV 87 08/28/2022   PLT 258 08/28/2022   Lab Results  Component Value Date   ALT 14 08/28/2022   AST 15 08/28/2022   ALKPHOS 129 (H) 08/28/2022   BILITOT 0.3 08/28/2022   Lab Results  Component Value Date   VD25OH 33 03/07/2018     Review of Systems  Constitutional:  Negative for appetite change, fatigue, fever and unexpected weight change.  HENT:  Negative for tinnitus and trouble swallowing.   Eyes:  Negative for visual disturbance.  Respiratory:  Negative for cough, chest tightness and shortness of breath.   Cardiovascular:  Negative for chest pain, palpitations and leg swelling.  Gastrointestinal:  Negative for abdominal pain.  Endocrine:  Negative for polydipsia and polyuria.  Genitourinary:  Negative for dysuria and hematuria.  Musculoskeletal:  Negative for arthralgias.  Neurological:  Negative for tremors, numbness and headaches.  Psychiatric/Behavioral:  Negative for dysphoric mood.     Patient Active Problem List   Diagnosis Date Noted   Acute rhinitis 10/14/2022   Prediabetes 08/28/2022   Mild hyperlipidemia 03/21/2020   Abnormal MRI of abdomen 09/06/2019   Essential hypertension 08/28/2019   Callus of foot 08/28/2019    Allergies  Allergen Reactions   Penicillins Hives    Past Surgical History:  Procedure Laterality Date   CHOLECYSTECTOMY  2019    Social History   Tobacco Use   Smoking status: Never   Smokeless tobacco: Never  Substance Use Topics   Alcohol use: Yes    Comment: occassional   Drug use: Never     Medication list has been reviewed and updated.  Current Meds  Medication Sig   Chlorpheniramine Maleate (ALLERGY PO) Take by mouth as needed.   losartan (COZAAR) 50 MG tablet Take 1 tablet (50 mg total) by mouth daily. TAKE 1 TABLET(25 MG) BY MOUTH DAILY       04/13/2023    2:40 PM 01/11/2023    9:19 AM 12/01/2022   10:30 AM 08/28/2022    8:27 AM  GAD 7 : Generalized Anxiety Score  Nervous, Anxious, on Edge 0 0 0 0  Control/stop worrying  0 0 0 0  Worry too much - different things 0 1 0 0  Trouble relaxing 1 0 0 0  Restless 0 0 0 0  Easily annoyed or irritable 1 0 0 0  Afraid - awful might happen 0 0 0 0  Total GAD 7 Score 2 1 0 0  Anxiety Difficulty Not difficult at all Not difficult at all Not difficult at all Not difficult at all       04/13/2023    2:39 PM 01/11/2023    9:19 AM 12/01/2022   10:30 AM  Depression screen PHQ 2/9  Decreased Interest 0 0 0  Down, Depressed, Hopeless 0 0 0  PHQ - 2 Score 0 0 0  Altered sleeping 0 0 0  Tired, decreased energy 0 1 1  Change in appetite 0 0 0  Feeling bad or failure about yourself  0 0 0  Trouble concentrating 0 0 0   Moving slowly or fidgety/restless 0 0 0  Suicidal thoughts 0 0 0  PHQ-9 Score 0 1 1  Difficult doing work/chores Not difficult at all Not difficult at all Not difficult at all    BP Readings from Last 3 Encounters:  04/13/23 127/78  01/11/23 138/80  12/01/22 126/70    Physical Exam Vitals and nursing note reviewed.  Constitutional:      General: She is not in acute distress.    Appearance: She is well-developed.  HENT:     Head: Normocephalic and atraumatic.  Pulmonary:     Effort: Pulmonary effort is normal. No respiratory distress.  Skin:    General: Skin is warm and dry.     Findings: No rash.  Neurological:     Mental Status: She is alert and oriented to person, place, and time.  Psychiatric:        Mood and Affect: Mood normal.        Behavior: Behavior normal.    Diabetic Foot Exam - Simple   Simple Foot Form Diabetic Foot exam was performed with the following findings: Yes 04/13/2023  2:52 PM  Visual Inspection No deformities, no ulcerations, no other skin breakdown bilaterally: Yes Sensation Testing Intact to touch and monofilament testing bilaterally: Yes Pulse Check Posterior Tibialis and Dorsalis pulse intact bilaterally: Yes Comments Callous on ball of left foot      Wt Readings from Last 3 Encounters:  04/13/23 165 lb 12.8 oz (75.2 kg)  01/11/23 164 lb 3.2 oz (74.5 kg)  12/01/22 160 lb 9.6 oz (72.8 kg)    BP 127/78 (BP Location: Left Arm, Cuff Size: Normal)   Pulse 73   Ht 5\' 2"  (1.575 m)   Wt 165 lb 12.8 oz (75.2 kg)   LMP  (LMP Unknown) Comment: menopause age 24  SpO2 98%   BMI 30.33 kg/m   Assessment and Plan:  Problem List Items Addressed This Visit       Unprioritized   Prediabetes - Primary (Chronic)    A1C higher last visit at 7.1 Re-enforced diet, exercise and weight loss in lieu of medication. A1C today = 6.6 which is much improved despite no weight loss. She will continue lifestyle changes for now.      Relevant Orders    POCT glycosylated hemoglobin (Hb A1C) (Completed)   Essential hypertension (Chronic)    BP controlled on losartan, along with diet and lifestyle changes.       Return in about 4 months (around 08/13/2023) for CPX.    Nyoka Cowden  Judithann Graves, MD [facility] Medicine [place]

## 2023-04-14 IMAGING — MG MM DIGITAL SCREENING BILAT W/ TOMO AND CAD
8 series · 8 of 24 positions shown · non-contrast
Comparison: Previous exam(s).

CLINICAL DATA: Screening.

EXAM:
DIGITAL SCREENING BILATERAL MAMMOGRAM WITH TOMOSYNTHESIS AND CAD
TECHNIQUE: Bilateral screening digital craniocaudal and mediolateral oblique
mammograms were obtained. Bilateral screening digital breast
tomosynthesis was performed. The images were evaluated with
computer-aided detection.

[R MLO synth-2D]
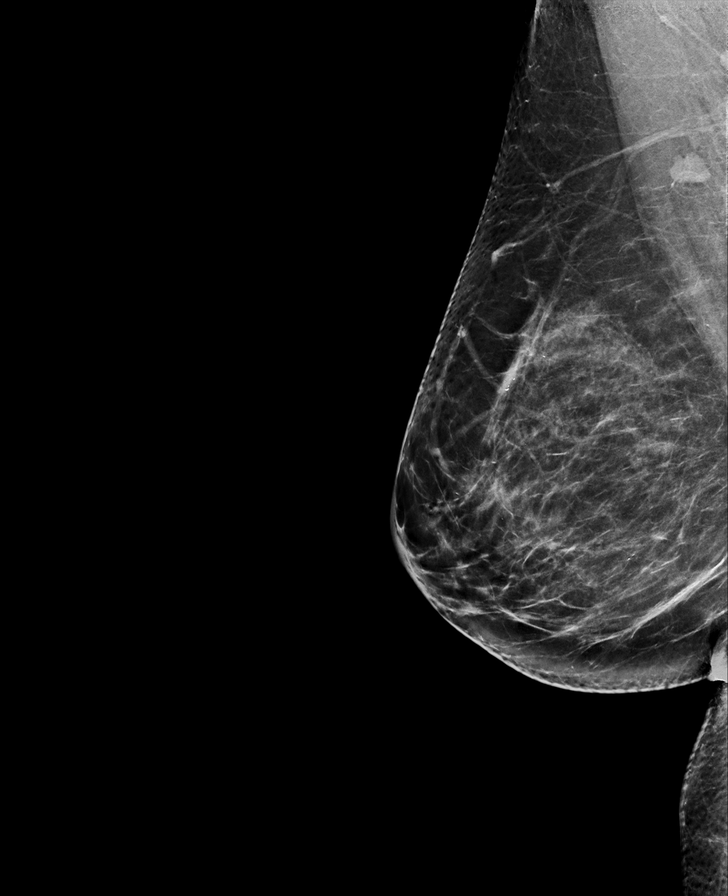

[R CC synth-2D]
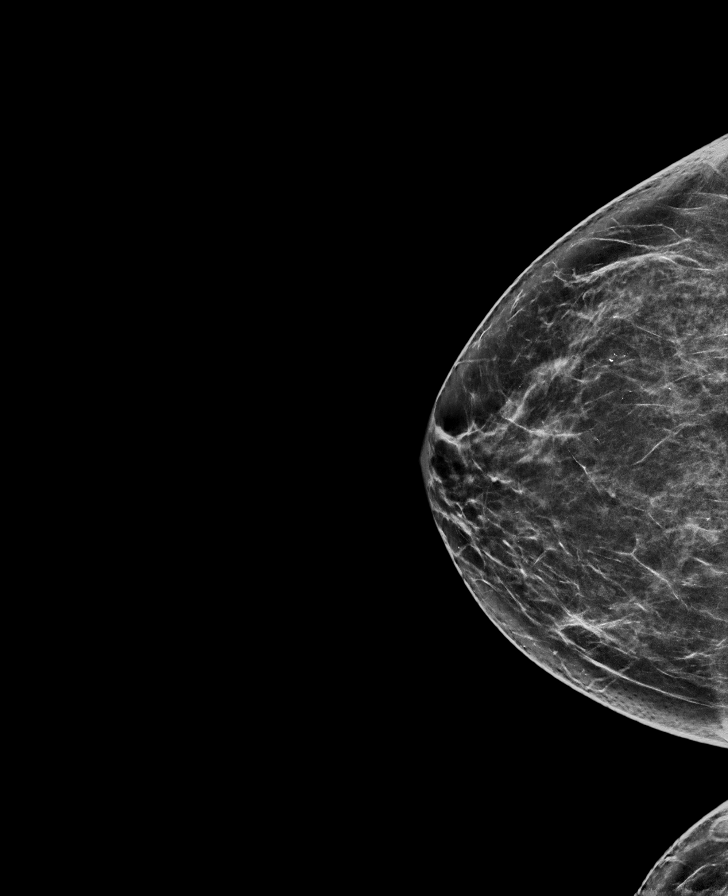

[L MLO synth-2D]
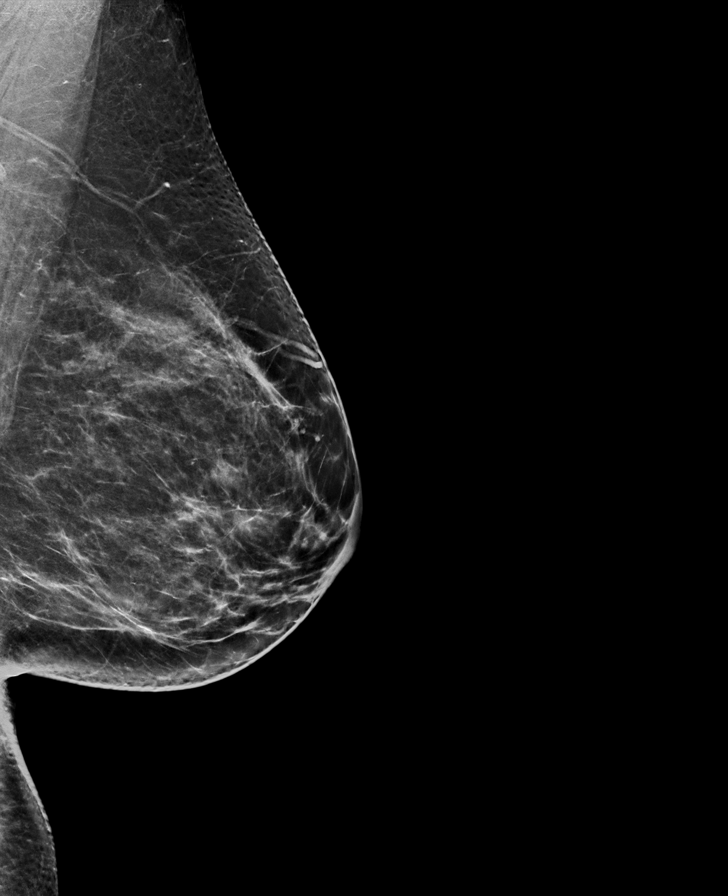

[L CC synth-2D]
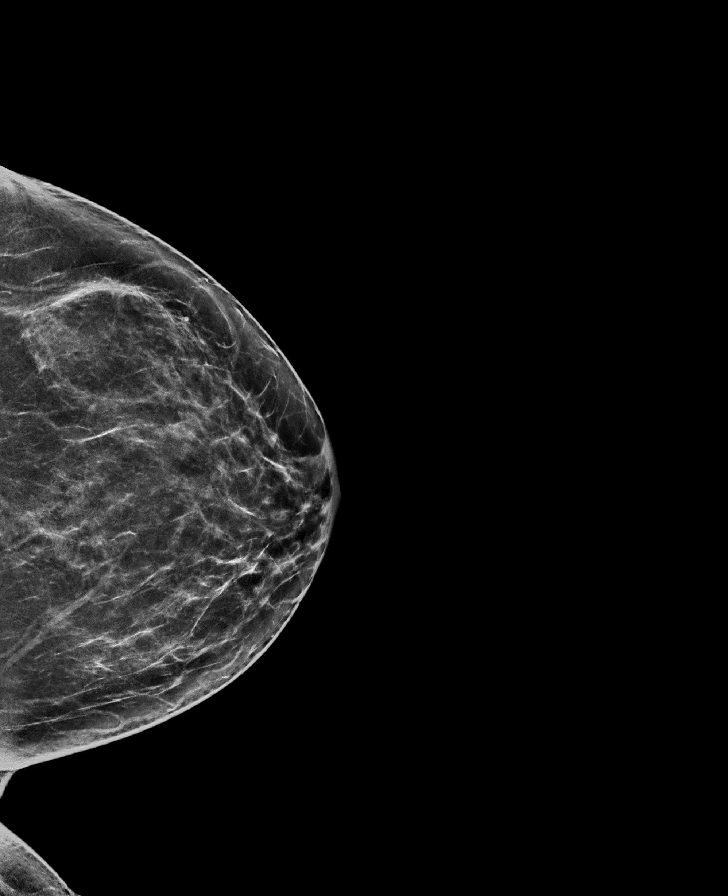

[R CC tomo · tomo slice 35/69.0]
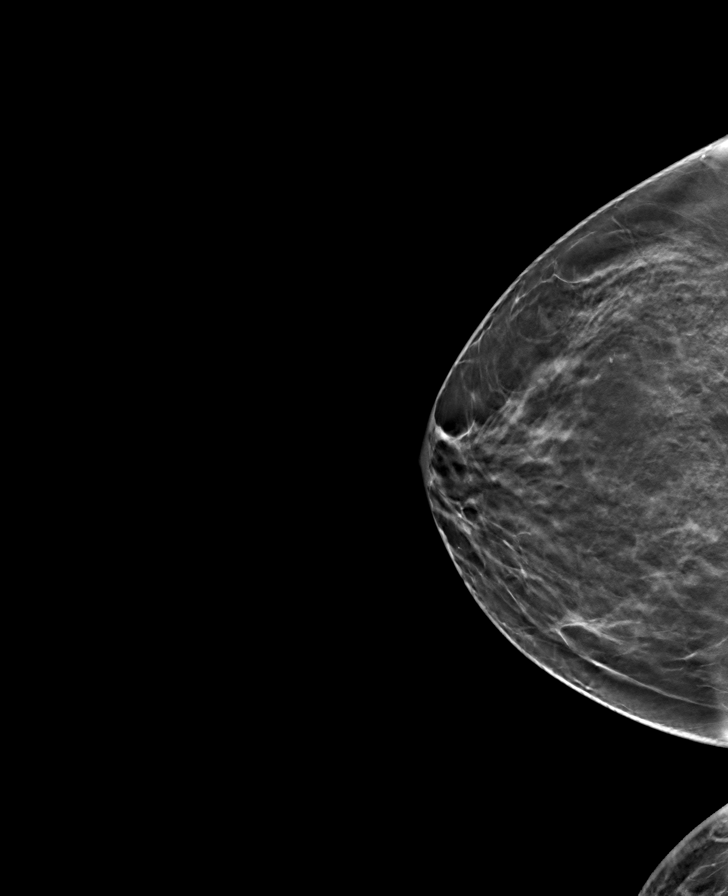

[R MLO tomo · tomo slice 40/79.0]
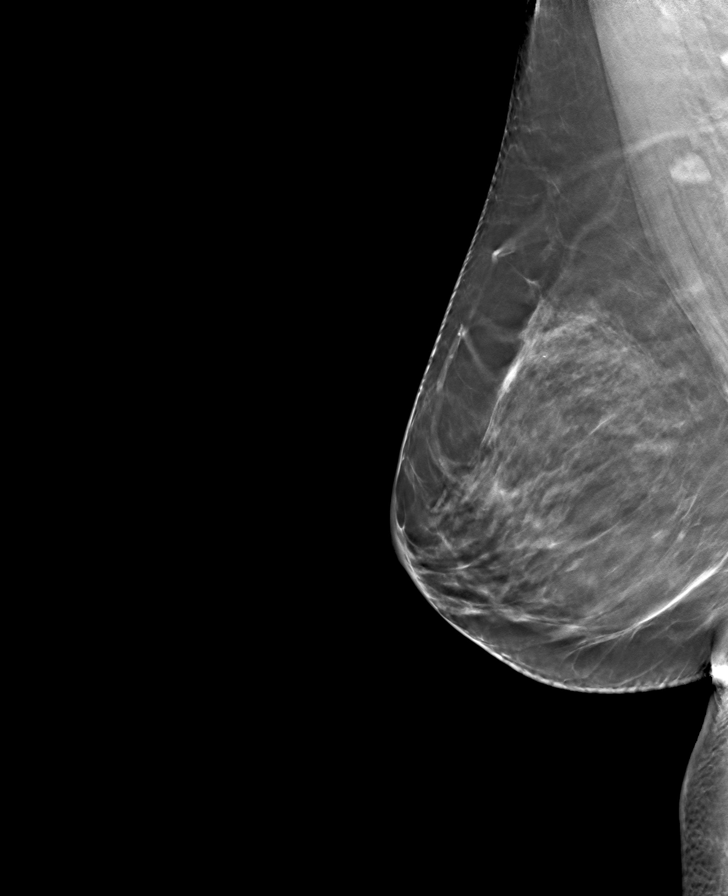

[L MLO tomo · tomo slice 37/74.0]
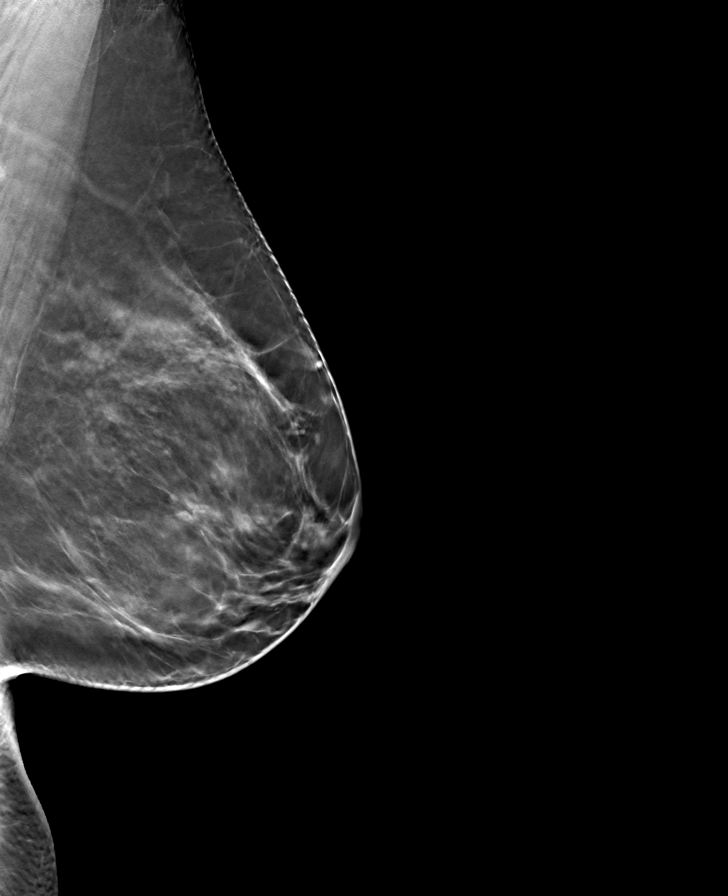

[L CC tomo · tomo slice 36/71.0]
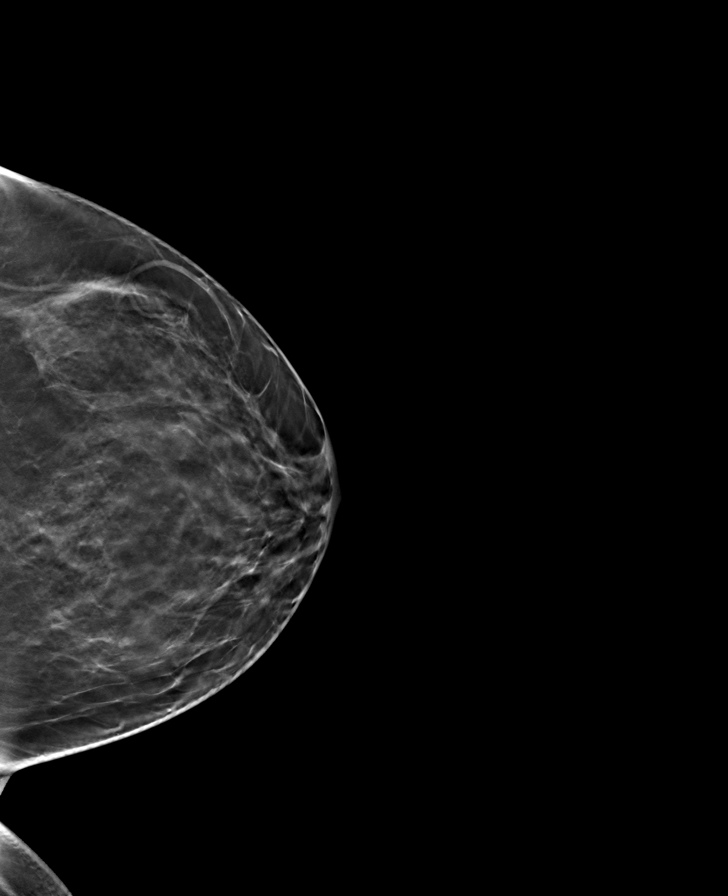

[8 of 24 positions shown; findings below may reference images not displayed]

ACR Breast Density Category b: There are scattered areas of
fibroglandular density.
FINDINGS: There are no findings suspicious for malignancy.
IMPRESSION: No mammographic evidence of malignancy. A result letter of this
screening mammogram will be mailed directly to the patient.

RECOMMENDATION:
Screening mammogram in one year. (Code:51-O-LD2)

BI-RADS CATEGORY  1: Negative.

## 2023-05-21 ENCOUNTER — Encounter: Payer: Self-pay | Admitting: Internal Medicine

## 2023-05-21 ENCOUNTER — Ambulatory Visit: Admitting: Internal Medicine

## 2023-05-21 VITALS — BP 132/70 | HR 66 | Ht 62.0 in | Wt 163.0 lb

## 2023-05-21 DIAGNOSIS — S61012A Laceration without foreign body of left thumb without damage to nail, initial encounter: Secondary | ICD-10-CM | POA: Diagnosis not present

## 2023-05-21 DIAGNOSIS — Z23 Encounter for immunization: Secondary | ICD-10-CM

## 2023-05-21 MED ORDER — SILVER SULFADIAZINE 1 % EX CREA: 1.0000 | TOPICAL_CREAM | Freq: Every day | CUTANEOUS | 0 refills | Status: DC

## 2023-05-21 NOTE — Progress Notes (Signed)
Date:  05/21/2023   Name:  Jenny Gordon   DOB:  05/11/1961   MRN:  [id number]   Chief Complaint: Laceration (Left thumb cut. Patient said Tuesday night, 3 nights ago patient was washing a knife and it cut her finger. Still bleeding. Patient has not put anything on it.)  Laceration  The incident occurred 3 to 5 days ago.  Cut at home with a kitchen knife on 05/18/23.  Applied gauze and did not try to remove it until yesterday and now adherent.    Review of Systems  Constitutional:  Negative for chills and fever.  Skin:  Positive for wound.     Lab Results  Component Value Date   NA 141 08/28/2022   K 4.3 08/28/2022   CO2 24 08/28/2022   GLUCOSE 123 (H) 08/28/2022   BUN 10 08/28/2022   CREATININE 0.63 08/28/2022   CALCIUM 9.7 08/28/2022   EGFR 101 08/28/2022   GFRNONAA 101 03/20/2020   Lab Results  Component Value Date   CHOL 193 08/28/2022   HDL 54 08/28/2022   LDLCALC 122 (H) 08/28/2022   TRIG 94 08/28/2022   CHOLHDL 3.6 08/28/2022   Lab Results  Component Value Date   TSH 1.580 08/28/2022   Lab Results  Component Value Date   HGBA1C 6.6 (A) 04/13/2023   Lab Results  Component Value Date   WBC 6.4 08/28/2022   HGB 13.0 08/28/2022   HCT 38.2 08/28/2022   MCV 87 08/28/2022   PLT 258 08/28/2022   Lab Results  Component Value Date   ALT 14 08/28/2022   AST 15 08/28/2022   ALKPHOS 129 (H) 08/28/2022   BILITOT 0.3 08/28/2022   Lab Results  Component Value Date   VD25OH 33 03/07/2018     Patient Active Problem List   Diagnosis Date Noted   Acute rhinitis 10/14/2022   Prediabetes 08/28/2022   Mild hyperlipidemia 03/21/2020   Abnormal MRI of abdomen 09/06/2019   Essential hypertension 08/28/2019   Callus of foot 08/28/2019    Allergies  Allergen Reactions   Penicillins Hives    Past Surgical History:  Procedure Laterality Date   CHOLECYSTECTOMY  2019    Social History   Tobacco Use   Smoking status: Never   Smokeless tobacco: Never   Substance Use Topics   Alcohol use: Yes    Comment: occassional   Drug use: Never     Medication list has been reviewed and updated.  Current Meds  Medication Sig   Chlorpheniramine Maleate (ALLERGY PO) Take by mouth as needed.   losartan (COZAAR) 50 MG tablet Take 1 tablet (50 mg total) by mouth daily. TAKE 1 TABLET(25 MG) BY MOUTH DAILY   silver sulfADIAZINE (SILVADENE) 1 % cream Apply 1 Application topically daily.       05/21/2023    8:56 AM 04/13/2023    2:40 PM 01/11/2023    9:19 AM 12/01/2022   10:30 AM  GAD 7 : Generalized Anxiety Score  Nervous, Anxious, on Edge 0 0 0 0  Control/stop worrying 0 0 0 0  Worry too much - different things 0 0 1 0  Trouble relaxing 0 1 0 0  Restless 0 0 0 0  Easily annoyed or irritable 0 1 0 0  Afraid - awful might happen 0 0 0 0  Total GAD 7 Score 0 2 1 0  Anxiety Difficulty Not difficult at all Not difficult at all Not difficult at all Not difficult at all  05/21/2023    8:56 AM 04/13/2023    2:39 PM 01/11/2023    9:19 AM  Depression screen PHQ 2/9  Decreased Interest 0 0 0  Down, Depressed, Hopeless 0 0 0  PHQ - 2 Score 0 0 0  Altered sleeping 0 0 0  Tired, decreased energy 0 0 1  Change in appetite 0 0 0  Feeling bad or failure about yourself  0 0 0  Trouble concentrating 0 0 0  Moving slowly or fidgety/restless 0 0 0  Suicidal thoughts 0 0 0  PHQ-9 Score 0 0 1  Difficult doing work/chores Not difficult at all Not difficult at all Not difficult at all    BP Readings from Last 3 Encounters:  05/21/23 132/70  04/13/23 127/78  01/11/23 138/80    Physical Exam Vitals and nursing note reviewed.  Constitutional:      General: She is not in acute distress.    Appearance: She is well-developed.  HENT:     Head: Normocephalic and atraumatic.  Pulmonary:     Effort: Pulmonary effort is normal. No respiratory distress.  Skin:    General: Skin is warm and dry.     Findings: No rash.     Comments: Laceration of left  distal thumb with adherent gauze - soaked then removed easily.  Fresh bleeding noted - no signs of purulence or infection.   Neurological:     Mental Status: She is alert and oriented to person, place, and time.  Psychiatric:        Mood and Affect: Mood normal.        Behavior: Behavior normal.     Wt Readings from Last 3 Encounters:  05/21/23 163 lb (73.9 kg)  04/13/23 165 lb 12.8 oz (75.2 kg)  01/11/23 164 lb 3.2 oz (74.5 kg)    BP 132/70   Pulse 66   Ht 5\' 2"  (1.575 m)   Wt 163 lb (73.9 kg)   LMP  (LMP Unknown) Comment: menopause age 64  SpO2 99%   BMI 29.81 kg/m   Assessment and Plan:  Problem List Items Addressed This Visit   None Visit Diagnoses     Thumb laceration, left, initial encounter    -  Primary   cleaned and gauze removed Tdap given apply TAO and cover with bandaid as needed until healed   Relevant Medications   silver sulfADIAZINE (SILVADENE) 1 % cream   Need for diphtheria-tetanus-pertussis (Tdap) vaccine       Relevant Orders   Tdap vaccine greater than or equal to 7yo IM       No follow-ups on file.    Reubin Milan, MD [facility] Medicine [place]

## 2023-06-01 ENCOUNTER — Other Ambulatory Visit: Payer: Self-pay | Admitting: Internal Medicine

## 2023-06-01 DIAGNOSIS — Z1231 Encounter for screening mammogram for malignant neoplasm of breast: Secondary | ICD-10-CM

## 2023-06-01 DIAGNOSIS — R921 Mammographic calcification found on diagnostic imaging of breast: Secondary | ICD-10-CM

## 2023-07-05 ENCOUNTER — Ambulatory Visit
Admission: RE | Admit: 2023-07-05 | Discharge: 2023-07-05 | Disposition: A | Discharge status: Home / Self Care | Source: Ambulatory Visit | Attending: Internal Medicine | Admitting: Internal Medicine

## 2023-07-05 DIAGNOSIS — Z1231 Encounter for screening mammogram for malignant neoplasm of breast: Secondary | ICD-10-CM | POA: Insufficient documentation

## 2023-07-05 DIAGNOSIS — R921 Mammographic calcification found on diagnostic imaging of breast: Secondary | ICD-10-CM | POA: Diagnosis present

## 2023-07-06 ENCOUNTER — Other Ambulatory Visit: Payer: Self-pay | Admitting: Internal Medicine

## 2023-07-06 DIAGNOSIS — R928 Other abnormal and inconclusive findings on diagnostic imaging of breast: Secondary | ICD-10-CM

## 2023-07-20 ENCOUNTER — Ambulatory Visit
Admission: RE | Admit: 2023-07-20 | Discharge: 2023-07-20 | Disposition: A | Discharge status: Home / Self Care | Source: Ambulatory Visit | Attending: Internal Medicine | Admitting: Internal Medicine

## 2023-07-20 DIAGNOSIS — R928 Other abnormal and inconclusive findings on diagnostic imaging of breast: Secondary | ICD-10-CM | POA: Insufficient documentation

## 2023-07-20 HISTORY — PX: BREAST BIOPSY: SHX20

## 2023-07-20 MED ORDER — LIDOCAINE 1 % OPTIME INJ - NO CHARGE
5.0000 mL | Freq: Once | INTRAMUSCULAR | Status: AC
  Administered 2023-07-20: 5 mL
  Filled 2023-07-20: qty 6

## 2023-07-20 MED ORDER — LIDOCAINE HCL 1 % IJ SOLN
10.0000 mL | Freq: Once | INTRAMUSCULAR | Status: AC
  Administered 2023-07-20: 10 mL
  Filled 2023-07-20: qty 10

## 2023-07-20 MED ORDER — LIDOCAINE-EPINEPHRINE 1 %-1:100000 IJ SOLN
10.0000 mL | Freq: Once | INTRAMUSCULAR | Status: AC
  Administered 2023-07-20: 10 mL
  Filled 2023-07-20: qty 10

## 2023-07-23 LAB — SURGICAL PATHOLOGY

## 2023-07-26 ENCOUNTER — Telehealth: Payer: Self-pay | Admitting: Internal Medicine

## 2023-07-26 NOTE — Telephone Encounter (Signed)
 Copied from CRM [id number]. Topic: General - Inquiry >> Jul 26, 2023  1:36 PM Tiffany B wrote: Reason for CRM: Patient returning call regarding her biopsy results.

## 2023-07-26 NOTE — Telephone Encounter (Signed)
 Patient informed of benign results. Will repeat mammos annually.

## 2023-09-05 ENCOUNTER — Other Ambulatory Visit: Payer: Self-pay | Admitting: Internal Medicine

## 2023-09-05 DIAGNOSIS — I10 Essential (primary) hypertension: Secondary | ICD-10-CM

## 2023-09-06 NOTE — Telephone Encounter (Signed)
Requested medication (s) are due for refill today: yes  Requested medication (s) are on the active medication list: yes  Last refill:  01/11/23 #90/1  Future visit scheduled: yes  Notes to clinic:  Unable to refill per protocol due to failed labs, no updated results.      Requested Prescriptions  Pending Prescriptions Disp Refills   losartan (COZAAR) 50 MG tablet [Pharmacy Med Name: LOSARTAN 50MG  TABLETS] 90 tablet 1    Sig: TAKE 1 TABLET BY MOUTH DAILY.     Cardiovascular:  Angiotensin Receptor Blockers Failed - 09/06/2023  3:49 PM      Failed - Cr in normal range and within 180 days    Creatinine, Ser  Date Value Ref Range Status  08/28/2022 0.63 0.57 - 1.00 mg/dL Final         Failed - K in normal range and within 180 days    Potassium  Date Value Ref Range Status  08/28/2022 4.3 3.5 - 5.2 mmol/L Final         Passed - Patient is not pregnant      Passed - Last BP in normal range    BP Readings from Last 1 Encounters:  05/21/23 132/70         Passed - Valid encounter within last 6 months    Recent Outpatient Visits           3 months ago Thumb laceration, left, initial encounter   [facility] at [facility], Nyoka Cowden, MD   4 months ago Prediabetes   [facility] at [facility], Nyoka Cowden, MD   7 months ago Essential hypertension   [place] [facility] at [facility], Nyoka Cowden, MD   9 months ago Allergic cough   [place] [facility] at [facility], Nyoka Cowden, MD   10 months ago Acute rhinitis   [place] [facility] at [facility], Ocie Bob, MD       Future Appointments             In 2 weeks Judithann Graves, Nyoka Cowden, MD [facility] Medicine at [facility], [facility]

## 2023-09-07 ENCOUNTER — Other Ambulatory Visit: Payer: Self-pay | Admitting: Internal Medicine

## 2023-09-07 DIAGNOSIS — I10 Essential (primary) hypertension: Secondary | ICD-10-CM

## 2023-09-07 LAB — HM DIABETES EYE EXAM

## 2023-09-08 NOTE — Telephone Encounter (Signed)
Requested Prescriptions  Pending Prescriptions Disp Refills   losartan (COZAAR) 50 MG tablet [Pharmacy Med Name: LOSARTAN 50MG  TABLETS] 90 tablet 0    Sig: TAKE 1 TABLET BY MOUTH DAILY.     Cardiovascular:  Angiotensin Receptor Blockers Failed - 09/08/2023  1:26 PM      Failed - Cr in normal range and within 180 days    Creatinine, Ser  Date Value Ref Range Status  08/28/2022 0.63 0.57 - 1.00 mg/dL Final         Failed - K in normal range and within 180 days    Potassium  Date Value Ref Range Status  08/28/2022 4.3 3.5 - 5.2 mmol/L Final         Passed - Patient is not pregnant      Passed - Last BP in normal range    BP Readings from Last 1 Encounters:  05/21/23 132/70         Passed - Valid encounter within last 6 months    Recent Outpatient Visits           3 months ago Thumb laceration, left, initial encounter   [facility] at [facility], Nyoka Cowden, MD   4 months ago Prediabetes   [facility] Medicine at [facility], Nyoka Cowden, MD   8 months ago Essential hypertension   [place] [facility] at [facility], Nyoka Cowden, MD   9 months ago Allergic cough   [place] [facility] at [facility], Nyoka Cowden, MD   10 months ago Acute rhinitis   [place] [facility] & Sports Medicine at [facility], Ocie Bob, MD       Future Appointments             In 1 week Judithann Graves, Nyoka Cowden, MD [facility] Health Primary Care & Sports Medicine at [facility], [facility]

## 2023-09-09 ENCOUNTER — Other Ambulatory Visit: Payer: Self-pay | Admitting: Internal Medicine

## 2023-09-09 DIAGNOSIS — I10 Essential (primary) hypertension: Secondary | ICD-10-CM

## 2023-09-09 NOTE — Telephone Encounter (Signed)
Copied from CRM [id number]. Topic: Clinical - Medication Refill >> Sep 09, 2023  9:58 AM Antwanette L wrote: Most Recent Primary Care Visit:  Provider: Reubin Milan  Department: [facility] [place]  Visit Type: SAME DAY  Date: 05/21/2023  Medication: losartan (COZAAR) 50 MG tablet  Has the patient contacted their pharmacy? Yes. Pharmacy is waiting on an approval from Dr. Bari Edward   Is this the correct pharmacy for this prescription? Yes  This is the patient's preferred pharmacy:  [id number], [place] - [address] AT [facility] [address] [place] [address] [place] [address] [place] [address] Phone: [phone] Fax: [phone]   Has the prescription been filled recently? No  Is the patient out of the medication? No, but close to running out.  Has the patient been seen for an appointment in the last year OR does the patient have an upcoming appointment? Yes  Can we respond through MyChart? No. Please contact patient by phone at [phone]  Agent: Please be advised that Rx refills may take up to 3 business days. We ask that you follow-up with your pharmacy.

## 2023-09-10 NOTE — Telephone Encounter (Signed)
Duplicate request, last refill 09/08/23 for 90 days.  Requested Prescriptions  Pending Prescriptions Disp Refills   losartan (COZAAR) 50 MG tablet 90 tablet 0    Sig: Take 1 tablet (50 mg total) by mouth daily.     Cardiovascular:  Angiotensin Receptor Blockers Failed - 09/10/2023  8:39 AM      Failed - Cr in normal range and within 180 days    Creatinine, Ser  Date Value Ref Range Status  08/28/2022 0.63 0.57 - 1.00 mg/dL Final         Failed - K in normal range and within 180 days    Potassium  Date Value Ref Range Status  08/28/2022 4.3 3.5 - 5.2 mmol/L Final         Passed - Patient is not pregnant      Passed - Last BP in normal range    BP Readings from Last 1 Encounters:  05/21/23 132/70         Passed - Valid encounter within last 6 months    Recent Outpatient Visits           3 months ago Thumb laceration, left, initial encounter   [facility] Primary [facility] Medicine at [facility], Nyoka Cowden, MD   5 months ago Prediabetes   [facility] Primary Care & Sports Medicine at [facility], Nyoka Cowden, MD   8 months ago Essential hypertension   [place] [facility] Sports Medicine at [facility], Nyoka Cowden, MD   9 months ago Allergic cough   [place] [facility] Medicine at [facility], Nyoka Cowden, MD   11 months ago Acute rhinitis   [place] [facility] & Sports Medicine at [facility], Ocie Bob, MD       Future Appointments             In 1 week Judithann Graves, Nyoka Cowden, MD [facility] Primary Care & Sports Medicine at [facility], [facility]

## 2023-09-20 ENCOUNTER — Encounter: Payer: Self-pay | Admitting: Internal Medicine

## 2023-12-13 ENCOUNTER — Other Ambulatory Visit: Payer: Self-pay | Admitting: Internal Medicine

## 2023-12-13 DIAGNOSIS — I10 Essential (primary) hypertension: Secondary | ICD-10-CM

## 2023-12-16 NOTE — Telephone Encounter (Signed)
 Requested Prescriptions  Pending Prescriptions Disp Refills   losartan  (COZAAR ) 50 MG tablet [Pharmacy Med Name: LOSARTAN  50MG  TABLETS] 90 tablet 0    Sig: TAKE 1 TABLET BY MOUTH DAILY.     Cardiovascular:  Angiotensin Receptor Blockers Failed - 12/16/2023  9:19 AM      Failed - Cr in normal range and within 180 days    Creatinine, Ser  Date Value Ref Range Status  08/28/2022 0.63 0.57 - 1.00 mg/dL Final         Failed - K in normal range and within 180 days    Potassium  Date Value Ref Range Status  08/28/2022 4.3 3.5 - 5.2 mmol/L Final         Failed - Valid encounter within last 6 months    Recent Outpatient Visits   None     Future Appointments             In 6 days Sheron Dixons, MD [facility] Health Primary Care & Sports Medicine at [facility], [facility]            Passed - Patient is not pregnant      Passed - Last BP in normal range    BP Readings from Last 1 Encounters:  05/21/23 132/70

## 2023-12-22 ENCOUNTER — Other Ambulatory Visit (HOSPITAL_COMMUNITY)
Admission: RE | Admit: 2023-12-22 | Discharge: 2023-12-22 | Disposition: A | Discharge status: Home / Self Care | Source: Ambulatory Visit | Attending: Internal Medicine | Admitting: Internal Medicine

## 2023-12-22 ENCOUNTER — Encounter: Payer: Self-pay | Admitting: Internal Medicine

## 2023-12-22 ENCOUNTER — Ambulatory Visit (INDEPENDENT_AMBULATORY_CARE_PROVIDER_SITE_OTHER): Admitting: Internal Medicine

## 2023-12-22 VITALS — BP 126/74 | HR 63 | Ht 62.0 in | Wt 164.1 lb

## 2023-12-22 DIAGNOSIS — Z1231 Encounter for screening mammogram for malignant neoplasm of breast: Secondary | ICD-10-CM | POA: Diagnosis not present

## 2023-12-22 DIAGNOSIS — E785 Hyperlipidemia, unspecified: Secondary | ICD-10-CM

## 2023-12-22 DIAGNOSIS — Z124 Encounter for screening for malignant neoplasm of cervix: Secondary | ICD-10-CM | POA: Diagnosis not present

## 2023-12-22 DIAGNOSIS — Z0000 Encounter for general adult medical examination without abnormal findings: Secondary | ICD-10-CM | POA: Insufficient documentation

## 2023-12-22 DIAGNOSIS — I10 Essential (primary) hypertension: Secondary | ICD-10-CM

## 2023-12-22 DIAGNOSIS — Z1211 Encounter for screening for malignant neoplasm of colon: Secondary | ICD-10-CM

## 2023-12-22 DIAGNOSIS — R7303 Prediabetes: Secondary | ICD-10-CM

## 2023-12-22 DIAGNOSIS — N841 Polyp of cervix uteri: Secondary | ICD-10-CM | POA: Insufficient documentation

## 2023-12-22 DIAGNOSIS — R935 Abnormal findings on diagnostic imaging of other abdominal regions, including retroperitoneum: Secondary | ICD-10-CM

## 2023-12-22 MED ORDER — LOSARTAN POTASSIUM 50 MG PO TABS: 50.0000 mg | ORAL_TABLET | Freq: Every day | ORAL | 1 refills | Status: DC

## 2023-12-22 NOTE — Assessment & Plan Note (Signed)
 Blood pressure is well controlled.  Current medications losartan Will continue same regimen along with efforts to limit dietary sodium.

## 2023-12-22 NOTE — Progress Notes (Signed)
 Date:  12/22/2023   Name:  Jenny Gordon   DOB:  1960/10/14   MRN:  [id number]   Chief Complaint: Annual Exam Jenny Gordon is a 63 y.o. female who presents today for her Complete Annual Exam. She feels well. She reports exercising walks, 3 - 4 times a week, 30 minutes. She reports she is sleeping well. Breast complaints none.  Health Maintenance  Topic Date Due   HIV Screening  Never done   Zoster (Shingles) Vaccine (1 of 2) Never done   Pap with HPV screening  04/21/2023   Colon Cancer Screening  06/02/2023   COVID-19 Vaccine (3 - 2024-25 season) 01/06/2025*   Flu Shot  02/18/2024   Mammogram  07/04/2024   DTaP/Tdap/Td vaccine (2 - Td or Tdap) 05/20/2033   Hepatitis C Screening  Completed   HPV Vaccine  Aged Out   Meningitis B Vaccine  Aged Out  *Topic was postponed. The date shown is not the original due date.    Hypertension This is a chronic problem. The problem is controlled. Pertinent negatives include no chest pain, headaches, palpitations or shortness of breath. Past treatments include angiotensin blockers. The current treatment provides significant improvement. There is no history of kidney disease, CAD/MI or CVA.    Review of Systems  Constitutional:  Negative for fatigue and unexpected weight change.  HENT:  Negative for trouble swallowing.   Eyes:  Negative for visual disturbance.  Respiratory:  Negative for cough, chest tightness, shortness of breath and wheezing.   Cardiovascular:  Negative for chest pain, palpitations and leg swelling.  Gastrointestinal:  Negative for abdominal pain, constipation and diarrhea.  Musculoskeletal:  Negative for arthralgias and myalgias.  Neurological:  Negative for dizziness, weakness, light-headedness and headaches.     Lab Results  Component Value Date   NA 141 08/28/2022   K 4.3 08/28/2022   CO2 24 08/28/2022   GLUCOSE 123 (H) 08/28/2022   BUN 10 08/28/2022   CREATININE 0.63 08/28/2022   CALCIUM 9.7 08/28/2022   EGFR 101  08/28/2022   GFRNONAA 101 03/20/2020   Lab Results  Component Value Date   CHOL 193 08/28/2022   HDL 54 08/28/2022   LDLCALC 122 (H) 08/28/2022   TRIG 94 08/28/2022   CHOLHDL 3.6 08/28/2022   Lab Results  Component Value Date   TSH 1.580 08/28/2022   Lab Results  Component Value Date   HGBA1C 6.6 (A) 04/13/2023   Lab Results  Component Value Date   WBC 6.4 08/28/2022   HGB 13.0 08/28/2022   HCT 38.2 08/28/2022   MCV 87 08/28/2022   PLT 258 08/28/2022   Lab Results  Component Value Date   ALT 14 08/28/2022   AST 15 08/28/2022   ALKPHOS 129 (H) 08/28/2022   BILITOT 0.3 08/28/2022   Lab Results  Component Value Date   VD25OH 33 03/07/2018     Patient Active Problem List   Diagnosis Date Noted   Endocervical polyp 12/22/2023   Prediabetes 08/28/2022   Mild hyperlipidemia 03/21/2020   Abnormal MRI of abdomen 09/06/2019   Essential hypertension 08/28/2019   Callus of foot 08/28/2019    Allergies  Allergen Reactions   Penicillins Hives    Past Surgical History:  Procedure Laterality Date   BREAST BIOPSY Right 07/20/2023   affirm bx, coil marker, path pending   BREAST BIOPSY Right 07/20/2023   MM RT BREAST BX W LOC DEV 1ST LESION IMAGE BX SPEC STEREO GUIDE 07/20/2023 [facility]   CHOLECYSTECTOMY  2019    Social History   Tobacco Use   Smoking status: Never   Smokeless tobacco: Never  Substance Use Topics   Alcohol use: Yes    Comment: occassional   Drug use: Never     Medication list has been reviewed and updated.  Current Meds  Medication Sig   Chlorpheniramine Maleate (ALLERGY PO) Take by mouth as needed.   [DISCONTINUED] losartan  (COZAAR ) 50 MG tablet TAKE 1 TABLET BY MOUTH DAILY.       12/22/2023   10:39 AM 05/21/2023    8:56 AM 04/13/2023    2:40 PM 01/11/2023    9:19 AM  GAD 7 : Generalized Anxiety Score  Nervous, Anxious, on Edge 0 0 0 0  Control/stop worrying 0 0 0 0  Worry too much - different things 0 0 0 1  Trouble  relaxing 0 0 1 0  Restless 0 0 0 0  Easily annoyed or irritable 0 0 1 0  Afraid - awful might happen 0 0 0 0  Total GAD 7 Score 0 0 2 1  Anxiety Difficulty Not difficult at all Not difficult at all Not difficult at all Not difficult at all       12/22/2023   10:38 AM 05/21/2023    8:56 AM 04/13/2023    2:39 PM  Depression screen PHQ 2/9  Decreased Interest 0 0 0  Down, Depressed, Hopeless 0 0 0  PHQ - 2 Score 0 0 0  Altered sleeping 0 0 0  Tired, decreased energy 0 0 0  Change in appetite 0 0 0  Feeling bad or failure about yourself  0 0 0  Trouble concentrating 0 0 0  Moving slowly or fidgety/restless 0 0 0  Suicidal thoughts 0 0 0  PHQ-9 Score 0 0 0  Difficult doing work/chores Not difficult at all Not difficult at all Not difficult at all    BP Readings from Last 3 Encounters:  12/22/23 126/74  05/21/23 132/70  04/13/23 127/78    Physical Exam Vitals and nursing note reviewed.  Constitutional:      General: She is not in acute distress.    Appearance: She is well-developed.  HENT:     Head: Normocephalic and atraumatic.     Right Ear: Tympanic membrane and ear canal normal.     Left Ear: Tympanic membrane and ear canal normal.     Nose:     Right Sinus: No maxillary sinus tenderness.     Left Sinus: No maxillary sinus tenderness.  Eyes:     General: No scleral icterus.       Right eye: No discharge.        Left eye: No discharge.     Conjunctiva/sclera: Conjunctivae normal.  Neck:     Thyroid: No thyromegaly.     Vascular: No carotid bruit.  Cardiovascular:     Rate and Rhythm: Normal rate and regular rhythm.     Pulses: Normal pulses.     Heart sounds: Normal heart sounds.  Pulmonary:     Effort: Pulmonary effort is normal. No respiratory distress.     Breath sounds: No wheezing.  Abdominal:     General: Bowel sounds are normal.     Palpations: Abdomen is soft.     Tenderness: There is no abdominal tenderness.  Genitourinary:    Labia:        Right:  No tenderness, lesion or injury.        Left: No  tenderness, lesion or injury.      Vagina: Normal.     Cervix: Lesion present.     Uterus: Normal.      Adnexa: Right adnexa normal and left adnexa normal.        Comments: Large endocervical polyp noted Pap obtained.  No bleeding noted. Musculoskeletal:     Cervical back: Normal range of motion. No erythema.     Right lower leg: No edema.     Left lower leg: No edema.  Lymphadenopathy:     Cervical: No cervical adenopathy.  Skin:    General: Skin is warm and dry.     Findings: No rash.  Neurological:     Mental Status: She is alert and oriented to person, place, and time.     Cranial Nerves: No cranial nerve deficit.     Sensory: No sensory deficit.     Deep Tendon Reflexes: Reflexes are normal and symmetric.  Psychiatric:        Attention and Perception: Attention normal.        Mood and Affect: Mood normal.     Wt Readings from Last 3 Encounters:  12/22/23 164 lb 2 oz (74.4 kg)  05/21/23 163 lb (73.9 kg)  04/13/23 165 lb 12.8 oz (75.2 kg)    BP 126/74   Pulse 63   Ht 5\' 2"  (1.575 m)   Wt 164 lb 2 oz (74.4 kg)   LMP  (LMP Unknown) Comment: menopause age 72  SpO2 97%   BMI 30.02 kg/m   Assessment and Plan:  Problem List Items Addressed This Visit       Unprioritized   Essential hypertension (Chronic)   Blood pressure is well controlled.  Current medications losartan . Will continue same regimen along with efforts to limit dietary sodium.       Relevant Medications   losartan  (COZAAR ) 50 MG tablet   Other Relevant Orders   CBC with Differential/Platelet   Comprehensive metabolic panel with GFR   TSH   Urinalysis, Routine w reflex microscopic   Mild hyperlipidemia (Chronic)   Continue diet, exercise and weight loss efforts.      Relevant Medications   losartan  (COZAAR ) 50 MG tablet   Other Relevant Orders   Lipid panel   Prediabetes (Chronic)   Managed with diet and exercise. Lab Results   Component Value Date   HGBA1C 6.6 (A) 04/13/2023  A1C higher last year - if worsening will need further follow up and possibly medication.       Relevant Orders   Hemoglobin A1c   Abnormal MRI of abdomen   Last MRI was in 2023 - lesion stable at that time. She has not been seen by surgery for follow up. Will discuss with patient regarding repeat imaging.      Endocervical polyp   Noted on exam today - pap obtained with broom and brush Will refer to GYN if needed      Other Visit Diagnoses       Annual physical exam    -  Primary   Relevant Orders   CBC with Differential/Platelet   Comprehensive metabolic panel with GFR   Hemoglobin A1c   Lipid panel   Cytology - PAP   TSH   Urinalysis, Routine w reflex microscopic     Encounter for screening mammogram for breast cancer       schedule in December   Relevant Orders   MM 3D SCREENING MAMMOGRAM BILATERAL BREAST  Encounter for screening for cervical cancer       Relevant Orders   Cytology - PAP     Colon cancer screening       Relevant Orders   Ambulatory referral to Gastroenterology       Return in about 6 months (around 06/22/2024) for HTN.    Sheron Dixons, MD [facility] Health Primary Care and Sports Medicine [place]

## 2023-12-22 NOTE — Assessment & Plan Note (Signed)
 Last MRI was in 2023 - lesion stable at that time. She has not been seen by surgery for follow up. Will discuss with patient regarding repeat imaging.

## 2023-12-22 NOTE — Assessment & Plan Note (Addendum)
 Managed with diet and exercise. Lab Results  Component Value Date   HGBA1C 6.6 (A) 04/13/2023  A1C higher last year - if worsening will need further follow up and possibly medication.

## 2023-12-22 NOTE — Assessment & Plan Note (Signed)
Continue diet,exercise and weight loss efforts.

## 2023-12-22 NOTE — Assessment & Plan Note (Signed)
 Noted on exam today - pap obtained with broom and brush Will refer to GYN if needed

## 2023-12-22 NOTE — Patient Instructions (Signed)
 Call [facility] to schedule your mammogram at [phone].

## 2023-12-23 ENCOUNTER — Other Ambulatory Visit: Payer: Self-pay | Admitting: Internal Medicine

## 2023-12-23 ENCOUNTER — Ambulatory Visit: Payer: Self-pay | Admitting: Internal Medicine

## 2023-12-23 DIAGNOSIS — K869 Disease of pancreas, unspecified: Secondary | ICD-10-CM | POA: Insufficient documentation

## 2023-12-23 DIAGNOSIS — E785 Hyperlipidemia, unspecified: Secondary | ICD-10-CM

## 2023-12-23 LAB — URINALYSIS, ROUTINE W REFLEX MICROSCOPIC
Bilirubin, UA: NEGATIVE
Glucose, UA: NEGATIVE
Ketones, UA: NEGATIVE
Nitrite, UA: NEGATIVE
Protein,UA: NEGATIVE
RBC, UA: NEGATIVE
Specific Gravity, UA: 1.009 (ref 1.005–1.030)
Urobilinogen, Ur: 0.2 mg/dL (ref 0.2–1.0)
pH, UA: 6.5 (ref 5.0–7.5)

## 2023-12-23 LAB — CBC WITH DIFFERENTIAL/PLATELET
Basophils Absolute: 0 10*3/uL (ref 0.0–0.2)
Basos: 1 %
EOS (ABSOLUTE): 0.1 10*3/uL (ref 0.0–0.4)
Eos: 1 %
Hematocrit: 39.3 % (ref 34.0–46.6)
Hemoglobin: 13 g/dL (ref 11.1–15.9)
Immature Grans (Abs): 0 10*3/uL (ref 0.0–0.1)
Immature Granulocytes: 0 %
Lymphocytes Absolute: 2.4 10*3/uL (ref 0.7–3.1)
Lymphs: 29 %
MCH: 29.5 pg (ref 26.6–33.0)
MCHC: 33.1 g/dL (ref 31.5–35.7)
MCV: 89 fL (ref 79–97)
Monocytes Absolute: 0.6 10*3/uL (ref 0.1–0.9)
Monocytes: 7 %
Neutrophils Absolute: 5.2 10*3/uL (ref 1.4–7.0)
Neutrophils: 62 %
Platelets: 237 10*3/uL (ref 150–450)
RBC: 4.41 x10E6/uL (ref 3.77–5.28)
RDW: 13.8 % (ref 11.7–15.4)
WBC: 8.4 10*3/uL (ref 3.4–10.8)

## 2023-12-23 LAB — HEMOGLOBIN A1C
Est. average glucose Bld gHb Est-mCnc: 148 mg/dL
Hgb A1c MFr Bld: 6.8 % — ABNORMAL HIGH (ref 4.8–5.6)

## 2023-12-23 LAB — MICROSCOPIC EXAMINATION
Bacteria, UA: NONE SEEN
Casts: NONE SEEN /LPF
Epithelial Cells (non renal): NONE SEEN /HPF (ref 0–10)
RBC, Urine: NONE SEEN /HPF (ref 0–2)

## 2023-12-23 LAB — COMPREHENSIVE METABOLIC PANEL WITH GFR
ALT: 17 IU/L (ref 0–32)
AST: 17 IU/L (ref 0–40)
Albumin: 4.5 g/dL (ref 3.9–4.9)
Alkaline Phosphatase: 124 IU/L — ABNORMAL HIGH (ref 44–121)
BUN/Creatinine Ratio: 29 — ABNORMAL HIGH (ref 12–28)
BUN: 15 mg/dL (ref 8–27)
Bilirubin Total: 0.3 mg/dL (ref 0.0–1.2)
CO2: 22 mmol/L (ref 20–29)
Calcium: 10 mg/dL (ref 8.7–10.3)
Chloride: 102 mmol/L (ref 96–106)
Creatinine, Ser: 0.51 mg/dL — ABNORMAL LOW (ref 0.57–1.00)
Globulin, Total: 2.3 g/dL (ref 1.5–4.5)
Glucose: 113 mg/dL — ABNORMAL HIGH (ref 70–99)
Potassium: 4 mmol/L (ref 3.5–5.2)
Sodium: 141 mmol/L (ref 134–144)
Total Protein: 6.8 g/dL (ref 6.0–8.5)
eGFR: 106 mL/min/{1.73_m2} (ref >59)

## 2023-12-23 LAB — CYTOLOGY - PAP
Comment: NEGATIVE
Diagnosis: NEGATIVE
High risk HPV: NEGATIVE

## 2023-12-23 LAB — LIPID PANEL
Chol/HDL Ratio: 3.6 ratio (ref 0.0–4.4)
Cholesterol, Total: 197 mg/dL (ref 100–199)
HDL: 54 mg/dL (ref >39)
LDL Chol Calc (NIH): 120 mg/dL — ABNORMAL HIGH (ref 0–99)
Triglycerides: 129 mg/dL (ref 0–149)
VLDL Cholesterol Cal: 23 mg/dL (ref 5–40)

## 2023-12-23 LAB — TSH: TSH: 0.832 u[IU]/mL (ref 0.450–4.500)

## 2023-12-23 MED ORDER — ROSUVASTATIN CALCIUM 5 MG PO TABS: 5.0000 mg | ORAL_TABLET | Freq: Every day | ORAL | 1 refills | Status: AC

## 2023-12-23 NOTE — Telephone Encounter (Signed)
 Please review.  KP

## 2023-12-23 NOTE — Progress Notes (Unsigned)
 Date:  12/23/2023   Name:  Jenny Gordon   DOB:  [birth year]   MRN:  [id number]   Chief Complaint: No chief complaint on file.  HPI  Review of Systems   Lab Results  Component Value Date   NA 141 12/22/2023   K 4.0 12/22/2023   CO2 22 12/22/2023   GLUCOSE 113 (H) 12/22/2023   BUN 15 12/22/2023   CREATININE 0.51 (L) 12/22/2023   CALCIUM 10.0 12/22/2023   EGFR 106 12/22/2023   GFRNONAA 101 03/20/2020   Lab Results  Component Value Date   CHOL 197 12/22/2023   HDL 54 12/22/2023   LDLCALC 120 (H) 12/22/2023   TRIG 129 12/22/2023   CHOLHDL 3.6 12/22/2023   Lab Results  Component Value Date   TSH 0.832 12/22/2023   Lab Results  Component Value Date   HGBA1C 6.8 (H) 12/22/2023   Lab Results  Component Value Date   WBC 8.4 12/22/2023   HGB 13.0 12/22/2023   HCT 39.3 12/22/2023   MCV 89 12/22/2023   PLT 237 12/22/2023   Lab Results  Component Value Date   ALT 17 12/22/2023   AST 17 12/22/2023   ALKPHOS 124 (H) 12/22/2023   BILITOT 0.3 12/22/2023   Lab Results  Component Value Date   VD25OH 33 03/07/2018     Patient Active Problem List   Diagnosis Date Noted   Endocervical polyp 12/22/2023   Prediabetes 08/28/2022   Mild hyperlipidemia 03/21/2020   Abnormal MRI of abdomen 09/06/2019   Essential hypertension 08/28/2019   Callus of foot 08/28/2019    Allergies  Allergen Reactions   Penicillins Hives    Past Surgical History:  Procedure Laterality Date   BREAST BIOPSY Right 07/20/2023   affirm bx, coil marker, path pending   BREAST BIOPSY Right 07/20/2023   MM RT BREAST BX W LOC DEV 1ST LESION IMAGE BX SPEC STEREO GUIDE 07/20/2023 [facility]   CHOLECYSTECTOMY  2019    Social History   Tobacco Use   Smoking status: Never   Smokeless tobacco: Never  Substance Use Topics   Alcohol use: Yes    Comment: occassional   Drug use: Never     Medication list has been reviewed and updated.  No outpatient medications have been marked as  taking for the 12/23/23 encounter (Orders Only) with Sheron Dixons, MD.       12/22/2023   10:39 AM 05/21/2023    8:56 AM 04/13/2023    2:40 PM 01/11/2023    9:19 AM  GAD 7 : Generalized Anxiety Score  Nervous, Anxious, on Edge 0 0 0 0  Control/stop worrying 0 0 0 0  Worry too much - different things 0 0 0 1  Trouble relaxing 0 0 1 0  Restless 0 0 0 0  Easily annoyed or irritable 0 0 1 0  Afraid - awful might happen 0 0 0 0  Total GAD 7 Score 0 0 2 1  Anxiety Difficulty Not difficult at all Not difficult at all Not difficult at all Not difficult at all       12/22/2023   10:38 AM 05/21/2023    8:56 AM 04/13/2023    2:39 PM  Depression screen PHQ 2/9  Decreased Interest 0 0 0  Down, Depressed, Hopeless 0 0 0  PHQ - 2 Score 0 0 0  Altered sleeping 0 0 0  Tired, decreased energy 0 0 0  Change in appetite 0 0 0  Feeling  bad or failure about yourself  0 0 0  Trouble concentrating 0 0 0  Moving slowly or fidgety/restless 0 0 0  Suicidal thoughts 0 0 0  PHQ-9 Score 0 0 0  Difficult doing work/chores Not difficult at all Not difficult at all Not difficult at all    BP Readings from Last 3 Encounters:  12/22/23 126/74  05/21/23 132/70  04/13/23 127/78    Physical Exam  Wt Readings from Last 3 Encounters:  12/22/23 164 lb 2 oz (74.4 kg)  05/21/23 163 lb (73.9 kg)  04/13/23 165 lb 12.8 oz (75.2 kg)    LMP  (LMP Unknown) Comment: menopause age 31  Assessment and Plan:  Problem List Items Addressed This Visit   None   No follow-ups on file.    Sheron Dixons, MD [facility] Primary Care and Sports Medicine [place]

## 2023-12-24 NOTE — Telephone Encounter (Signed)
 Please review.  KP

## 2023-12-29 ENCOUNTER — Ambulatory Visit
Admission: RE | Admit: 2023-12-29 | Discharge: 2023-12-29 | Disposition: A | Discharge status: Home / Self Care | Source: Ambulatory Visit | Attending: Internal Medicine | Admitting: Internal Medicine

## 2023-12-29 DIAGNOSIS — K869 Disease of pancreas, unspecified: Secondary | ICD-10-CM | POA: Diagnosis present

## 2023-12-29 MED ORDER — GADOBUTROL 1 MMOL/ML IV SOLN
7.5000 mL | Freq: Once | INTRAVENOUS | Status: AC | PRN
  Administered 2023-12-29: 7.5 mL via INTRAVENOUS

## 2024-01-03 ENCOUNTER — Ambulatory Visit: Payer: Self-pay | Admitting: Internal Medicine

## 2024-01-03 DIAGNOSIS — R935 Abnormal findings on diagnostic imaging of other abdominal regions, including retroperitoneum: Secondary | ICD-10-CM

## 2024-02-02 ENCOUNTER — Telehealth: Payer: Self-pay

## 2024-02-02 NOTE — Telephone Encounter (Signed)
 Spoke with patient and informed her she was referred to GI for a colonoscopy. She verbalized understanding.  JM  Copied from CRM [id number]. Topic: Referral - Question >> Feb 02, 2024 11:53 AM Silvana PARAS wrote: Reason for CRM: Pt calling to verify why she was referred to GI. Callback number is [phone].

## 2024-03-23 ENCOUNTER — Encounter: Payer: Self-pay | Admitting: Internal Medicine

## 2024-03-23 ENCOUNTER — Ambulatory Visit: Admitting: Internal Medicine

## 2024-03-23 VITALS — BP 120/74 | HR 65 | Ht 62.0 in | Wt 164.0 lb

## 2024-03-23 DIAGNOSIS — E785 Hyperlipidemia, unspecified: Secondary | ICD-10-CM | POA: Diagnosis not present

## 2024-03-23 DIAGNOSIS — R4586 Emotional lability: Secondary | ICD-10-CM

## 2024-03-23 DIAGNOSIS — I10 Essential (primary) hypertension: Secondary | ICD-10-CM

## 2024-03-23 DIAGNOSIS — R7303 Prediabetes: Secondary | ICD-10-CM | POA: Diagnosis not present

## 2024-03-23 NOTE — Assessment & Plan Note (Signed)
 Blood pressure is well controlled on losartan . No medication side effects noted. Plan to continue current medications.

## 2024-03-23 NOTE — Progress Notes (Signed)
 Date:  03/23/2024   Name:  Jenny Gordon   DOB:  04/20/1961   MRN:  [id number]   Chief Complaint: Hyperlipidemia and Diabetes  Hyperlipidemia This is a chronic problem. Recent lipid tests were reviewed and are high. Pertinent negatives include no chest pain, myalgias or shortness of breath. Current antihyperlipidemic treatment includes diet change and exercise.  Diabetes She presents for her follow-up diabetic visit. Diabetes type: prediabetes. Pertinent negatives for hypoglycemia include no dizziness or headaches. Pertinent negatives for diabetes include no chest pain, no fatigue and no weakness.  Hypertension This is a chronic problem. The problem is controlled. Pertinent negatives include no chest pain, headaches, palpitations or shortness of breath. Past treatments include angiotensin blockers.  Mood changes - she felt irritable and moody for the month of June, not really depressed or anxious.  She wonders if it could have been hormonal although she is 10 yrs into menopause.  Symptoms resolved with prayer and have not returned.   Review of Systems  Constitutional:  Negative for fatigue and unexpected weight change.  HENT:  Negative for trouble swallowing.   Eyes:  Negative for visual disturbance.  Respiratory:  Negative for cough, chest tightness, shortness of breath and wheezing.   Cardiovascular:  Negative for chest pain, palpitations and leg swelling.  Gastrointestinal:  Negative for abdominal pain, constipation and diarrhea.  Musculoskeletal:  Negative for arthralgias and myalgias.  Neurological:  Negative for dizziness, weakness, light-headedness and headaches.     Lab Results  Component Value Date   NA 141 12/22/2023   K 4.0 12/22/2023   CO2 22 12/22/2023   GLUCOSE 113 (H) 12/22/2023   BUN 15 12/22/2023   CREATININE 0.51 (L) 12/22/2023   CALCIUM  10.0 12/22/2023   EGFR 106 12/22/2023   GFRNONAA 101 03/20/2020   Lab Results  Component Value Date   CHOL 197 12/22/2023    HDL 54 12/22/2023   LDLCALC 120 (H) 12/22/2023   TRIG 129 12/22/2023   CHOLHDL 3.6 12/22/2023   Lab Results  Component Value Date   TSH 0.832 12/22/2023   Lab Results  Component Value Date   HGBA1C 6.8 (H) 12/22/2023   Lab Results  Component Value Date   WBC 8.4 12/22/2023   HGB 13.0 12/22/2023   HCT 39.3 12/22/2023   MCV 89 12/22/2023   PLT 237 12/22/2023   Lab Results  Component Value Date   ALT 17 12/22/2023   AST 17 12/22/2023   ALKPHOS 124 (H) 12/22/2023   BILITOT 0.3 12/22/2023   Lab Results  Component Value Date   VD25OH 33 03/07/2018     Patient Active Problem List   Diagnosis Date Noted   Lesion of pancreas 12/23/2023   Endocervical polyp 12/22/2023   Prediabetes 08/28/2022   Mild hyperlipidemia 03/21/2020   Abnormal MRI of abdomen 09/06/2019   Essential hypertension 08/28/2019   Callus of foot 08/28/2019    Allergies  Allergen Reactions   Penicillins Hives    Past Surgical History:  Procedure Laterality Date   BREAST BIOPSY Right 07/20/2023   affirm bx, coil marker, path pending   BREAST BIOPSY Right 07/20/2023   MM RT BREAST BX W LOC DEV 1ST LESION IMAGE BX SPEC STEREO GUIDE 07/20/2023 [facility]   CHOLECYSTECTOMY  2019    Social History   Tobacco Use   Smoking status: Never   Smokeless tobacco: Never  Substance Use Topics   Alcohol use: Yes    Comment: occassional   Drug use: Never  Medication list has been reviewed and updated.  Current Meds  Medication Sig   Chlorpheniramine Maleate (ALLERGY PO) Take by mouth as needed.   losartan  (COZAAR ) 50 MG tablet Take 1 tablet (50 mg total) by mouth daily.       03/23/2024    8:29 AM 12/22/2023   10:39 AM 05/21/2023    8:56 AM 04/13/2023    2:40 PM  GAD 7 : Generalized Anxiety Score  Nervous, Anxious, on Edge 0 0 0 0  Control/stop worrying 0 0 0 0  Worry too much - different things 0 0 0 0  Trouble relaxing 0 0 0 1  Restless 0 0 0 0  Easily annoyed or irritable 0 0 0 1   Afraid - awful might happen 0 0 0 0  Total GAD 7 Score 0 0 0 2  Anxiety Difficulty Not difficult at all Not difficult at all Not difficult at all Not difficult at all       03/23/2024    8:29 AM 12/22/2023   10:38 AM 05/21/2023    8:56 AM  Depression screen PHQ 2/9  Decreased Interest 0 0 0  Down, Depressed, Hopeless 0 0 0  PHQ - 2 Score 0 0 0  Altered sleeping 0 0 0  Tired, decreased energy 0 0 0  Change in appetite 0 0 0  Feeling bad or failure about yourself  0 0 0  Trouble concentrating 0 0 0  Moving slowly or fidgety/restless 0 0 0  Suicidal thoughts 0 0 0  PHQ-9 Score 0 0 0  Difficult doing work/chores Not difficult at all Not difficult at all Not difficult at all    BP Readings from Last 3 Encounters:  03/23/24 120/74  12/22/23 126/74  05/21/23 132/70    Physical Exam Vitals and nursing note reviewed.  Constitutional:      General: She is not in acute distress.    Appearance: She is well-developed.  HENT:     Head: Normocephalic and atraumatic.  Neck:     Vascular: No carotid bruit.  Cardiovascular:     Rate and Rhythm: Normal rate and regular rhythm.  Pulmonary:     Effort: Pulmonary effort is normal. No respiratory distress.     Breath sounds: No wheezing or rhonchi.  Musculoskeletal:     Cervical back: Normal range of motion.     Right lower leg: No edema.     Left lower leg: No edema.  Lymphadenopathy:     Cervical: No cervical adenopathy.  Skin:    General: Skin is warm and dry.     Findings: No rash.  Neurological:     Mental Status: She is alert and oriented to person, place, and time.  Psychiatric:        Mood and Affect: Mood normal.        Behavior: Behavior normal.     Wt Readings from Last 3 Encounters:  03/23/24 164 lb (74.4 kg)  12/22/23 164 lb 2 oz (74.4 kg)  05/21/23 163 lb (73.9 kg)    BP 120/74   Pulse 65   Ht 5' 2 (1.575 m)   Wt 164 lb (74.4 kg)   LMP  (LMP Unknown) Comment: menopause age 17  SpO2 98%   BMI 30.00 kg/m    Assessment and Plan:  Problem List Items Addressed This Visit       Unprioritized   Essential hypertension - Primary (Chronic)   Blood pressure is well controlled on losartan .  No medication side effects noted. Plan to continue current medications.       Mild hyperlipidemia (Chronic)   Diet control only at this time.  Crestor  recommended due to A1C in diabetic range but she wanted to work on low fat diet. Will recheck lipids       Relevant Orders   Lipid panel   Prediabetes (Chronic)   Managed with diet only.   A1C in diabetic range last visit. She has cut back on carbohydrates except for desserts like cookies or cake. She has not lost any weight however.  Will recheck A1C today and advise. Lab Results  Component Value Date   HGBA1C 6.8 (H) 12/22/2023         Relevant Orders   Hemoglobin A1c   Other Visit Diagnoses       Mood changes       transient changes resolved now doubt hormonally mediated if recurrent and persistent, return for follow up       No follow-ups on file.    Leita HILARIO Adie, MD [facility] [place]

## 2024-03-23 NOTE — Assessment & Plan Note (Addendum)
 Managed with diet only.   A1C in diabetic range last visit. She has cut back on carbohydrates except for desserts like cookies or cake. She has not lost any weight however.  Will recheck A1C today and advise. Lab Results  Component Value Date   HGBA1C 6.8 (H) 12/22/2023

## 2024-03-23 NOTE — Assessment & Plan Note (Addendum)
 Diet control only at this time.  Crestor  recommended due to A1C in diabetic range but she wanted to work on low fat diet. Will recheck lipids

## 2024-03-24 ENCOUNTER — Ambulatory Visit: Payer: Self-pay | Admitting: Internal Medicine

## 2024-03-24 DIAGNOSIS — R7303 Prediabetes: Secondary | ICD-10-CM

## 2024-03-24 LAB — LIPID PANEL
Chol/HDL Ratio: 3.9 ratio (ref 0.0–4.4)
Cholesterol, Total: 201 mg/dL — ABNORMAL HIGH (ref 100–199)
HDL: 52 mg/dL (ref >39)
LDL Chol Calc (NIH): 128 mg/dL — ABNORMAL HIGH (ref 0–99)
Triglycerides: 120 mg/dL (ref 0–149)
VLDL Cholesterol Cal: 21 mg/dL (ref 5–40)

## 2024-03-24 LAB — HEMOGLOBIN A1C
Est. average glucose Bld gHb Est-mCnc: 151 mg/dL
Hgb A1c MFr Bld: 6.9 % — ABNORMAL HIGH (ref 4.8–5.6)

## 2024-03-24 MED ORDER — METFORMIN HCL ER 500 MG PO TB24: 500.0000 mg | ORAL_TABLET | Freq: Every day | ORAL | 0 refills | Status: AC

## 2024-03-24 NOTE — Telephone Encounter (Signed)
 Please review.  KP

## 2024-05-23 ENCOUNTER — Encounter: Admitting: Internal Medicine

## 2024-06-20 ENCOUNTER — Ambulatory Visit: Admitting: Internal Medicine

## 2024-06-21 ENCOUNTER — Other Ambulatory Visit: Payer: Self-pay | Admitting: Internal Medicine

## 2024-06-21 DIAGNOSIS — I10 Essential (primary) hypertension: Secondary | ICD-10-CM

## 2024-06-21 DIAGNOSIS — R7303 Prediabetes: Secondary | ICD-10-CM

## 2024-06-23 ENCOUNTER — Encounter: Payer: Self-pay | Admitting: Internal Medicine

## 2024-06-23 ENCOUNTER — Ambulatory Visit: Admitting: Internal Medicine

## 2024-06-23 VITALS — BP 138/78 | HR 63 | Ht 62.0 in | Wt 164.0 lb

## 2024-06-23 DIAGNOSIS — E785 Hyperlipidemia, unspecified: Secondary | ICD-10-CM | POA: Diagnosis not present

## 2024-06-23 DIAGNOSIS — E118 Type 2 diabetes mellitus with unspecified complications: Secondary | ICD-10-CM | POA: Diagnosis not present

## 2024-06-23 DIAGNOSIS — Z7984 Long term (current) use of oral hypoglycemic drugs: Secondary | ICD-10-CM | POA: Diagnosis not present

## 2024-06-23 DIAGNOSIS — I10 Essential (primary) hypertension: Secondary | ICD-10-CM | POA: Diagnosis not present

## 2024-06-23 LAB — POCT GLYCOSYLATED HEMOGLOBIN (HGB A1C): Hemoglobin A1C: 6.5 % — AB (ref 4.0–5.6)

## 2024-06-23 NOTE — Progress Notes (Signed)
 Date:  06/23/2024   Name:  Jenny Gordon   DOB:  December 27, 1960   MRN:  [id number]   Chief Complaint: Medical Management of Chronic Issues (HTN, DM)  Hypertension This is a chronic problem. The problem is controlled. Pertinent negatives include no chest pain, headaches, palpitations or shortness of breath. Past treatments include angiotensin blockers. The current treatment provides significant improvement.  Diabetes She presents for her follow-up diabetic visit. She has type 2 diabetes mellitus. Her disease course has been worsening. Pertinent negatives for hypoglycemia include no dizziness or headaches. Pertinent negatives for diabetes include no chest pain, no fatigue and no weakness. Current diabetic treatment includes diet. An ACE inhibitor/angiotensin II receptor blocker is being taken.  Hyperlipidemia This is a chronic problem. Recent lipid tests were reviewed and are high. Pertinent negatives include no chest pain, myalgias or shortness of breath. Current antihyperlipidemic treatment includes diet change and exercise. The current treatment provides no improvement of lipids.    Review of Systems  Constitutional:  Negative for fatigue and unexpected weight change.  HENT:  Negative for trouble swallowing.   Eyes:  Negative for visual disturbance.  Respiratory:  Negative for cough, chest tightness, shortness of breath and wheezing.   Cardiovascular:  Negative for chest pain, palpitations and leg swelling.  Gastrointestinal:  Negative for abdominal pain, constipation and diarrhea.  Musculoskeletal:  Negative for arthralgias and myalgias.  Neurological:  Negative for dizziness, weakness, light-headedness and headaches.     Lab Results  Component Value Date   NA 141 12/22/2023   K 4.0 12/22/2023   CO2 22 12/22/2023   GLUCOSE 113 (H) 12/22/2023   BUN 15 12/22/2023   CREATININE 0.51 (L) 12/22/2023   CALCIUM  10.0 12/22/2023   EGFR 106 12/22/2023   GFRNONAA 101 03/20/2020   Lab Results   Component Value Date   CHOL 201 (H) 03/23/2024   HDL 52 03/23/2024   LDLCALC 128 (H) 03/23/2024   TRIG 120 03/23/2024   CHOLHDL 3.9 03/23/2024   Lab Results  Component Value Date   TSH 0.832 12/22/2023   Lab Results  Component Value Date   HGBA1C 6.5 (A) 06/23/2024   Lab Results  Component Value Date   WBC 8.4 12/22/2023   HGB 13.0 12/22/2023   HCT 39.3 12/22/2023   MCV 89 12/22/2023   PLT 237 12/22/2023   Lab Results  Component Value Date   ALT 17 12/22/2023   AST 17 12/22/2023   ALKPHOS 124 (H) 12/22/2023   BILITOT 0.3 12/22/2023   Lab Results  Component Value Date   VD25OH 33 03/07/2018     Patient Active Problem List   Diagnosis Date Noted   Lesion of pancreas 12/23/2023   Endocervical polyp 12/22/2023   Type II diabetes mellitus with complication (HCC) 08/28/2022   Mild hyperlipidemia 03/21/2020   Abnormal MRI of abdomen 09/06/2019   Essential hypertension 08/28/2019   Callus of foot 08/28/2019    Allergies  Allergen Reactions   Penicillins Hives    Past Surgical History:  Procedure Laterality Date   BREAST BIOPSY Right 07/20/2023   affirm bx, coil marker, path pending   BREAST BIOPSY Right 07/20/2023   MM RT BREAST BX W LOC DEV 1ST LESION IMAGE BX SPEC STEREO GUIDE 07/20/2023 [facility]   CHOLECYSTECTOMY  2019    Social History   Tobacco Use   Smoking status: Never   Smokeless tobacco: Never  Substance Use Topics   Alcohol use: Yes    Comment: occassional  Drug use: Never     Medication list has been reviewed and updated.  Current Meds  Medication Sig   Chlorpheniramine Maleate (ALLERGY PO) Take by mouth as needed.   metFORMIN  (GLUCOPHAGE -XR) 500 MG 24 hr tablet Take 1 tablet (500 mg total) by mouth daily with breakfast.   rosuvastatin  (CRESTOR ) 5 MG tablet Take 1 tablet (5 mg total) by mouth daily.   [DISCONTINUED] losartan  (COZAAR ) 50 MG tablet Take 1 tablet (50 mg total) by mouth daily.       03/23/2024    8:29 AM  12/22/2023   10:39 AM 05/21/2023    8:56 AM 04/13/2023    2:40 PM  GAD 7 : Generalized Anxiety Score  Nervous, Anxious, on Edge 0 0 0 0  Control/stop worrying 0 0 0 0  Worry too much - different things 0 0 0 0  Trouble relaxing 0 0 0 1  Restless 0 0 0 0  Easily annoyed or irritable 0 0 0 1  Afraid - awful might happen 0 0 0 0  Total GAD 7 Score 0 0 0 2  Anxiety Difficulty Not difficult at all Not difficult at all Not difficult at all Not difficult at all       06/23/2024    9:06 AM 03/23/2024    8:29 AM 12/22/2023   10:38 AM  Depression screen PHQ 2/9  Decreased Interest 0 0 0  Down, Depressed, Hopeless 0 0 0  PHQ - 2 Score 0 0 0  Altered sleeping  0 0  Tired, decreased energy  0 0  Change in appetite  0 0  Feeling bad or failure about yourself   0 0  Trouble concentrating  0 0  Moving slowly or fidgety/restless  0 0  Suicidal thoughts  0 0  PHQ-9 Score  0  0   Difficult doing work/chores  Not difficult at all Not difficult at all     Data saved with a previous flowsheet row definition    BP Readings from Last 3 Encounters:  06/23/24 138/78  03/23/24 120/74  12/22/23 126/74    Physical Exam Vitals and nursing note reviewed.  Constitutional:      General: She is not in acute distress.    Appearance: Normal appearance. She is well-developed.  HENT:     Head: Normocephalic and atraumatic.  Cardiovascular:     Rate and Rhythm: Normal rate and regular rhythm.  Pulmonary:     Effort: Pulmonary effort is normal. No respiratory distress.     Breath sounds: No wheezing or rhonchi.  Musculoskeletal:     Cervical back: Normal range of motion.     Right lower leg: No edema.     Left lower leg: No edema.  Lymphadenopathy:     Cervical: No cervical adenopathy.  Skin:    General: Skin is warm and dry.     Findings: No rash.  Neurological:     Mental Status: She is alert and oriented to person, place, and time.  Psychiatric:        Mood and Affect: Mood normal.         Behavior: Behavior normal.     Wt Readings from Last 3 Encounters:  06/23/24 164 lb (74.4 kg)  03/23/24 164 lb (74.4 kg)  12/22/23 164 lb 2 oz (74.4 kg)    BP 138/78   Pulse 63   Ht 5' 2 (1.575 m)   Wt 164 lb (74.4 kg)   LMP  (LMP Unknown)  Comment: menopause age 63  SpO2 98%   BMI 30.00 kg/m   Assessment and Plan:  Problem List Items Addressed This Visit       Unprioritized   Essential hypertension - Primary (Chronic)   Well controlled blood pressure today. Systolic slightly elevated but acceptable. Current regimen is losartan . No medication side effects noted.        Mild hyperlipidemia (Chronic)   Lipids did not improved with diet changes. Crestor  recommended but patient is not taking it yet. She realizes that she will not improve enough with diet only. Will start the Crestor  5 mg and follow up for labs in 3 months.       Relevant Orders   POCT glycosylated hemoglobin (Hb A1C) (Completed)   Type II diabetes mellitus with complication (HCC)   She did not start metformin  due to elevated A1C of 6.9. A1C today = 6.5 Will start metformin  ER 500 mg once a day. Follow up in 3 months for labs      Other Visit Diagnoses       Long term current use of oral hypoglycemic drug           Return in about 3 months (around 09/21/2024) for Dr. Lemon.    Leita HILARIO Adie, MD [facility] [place]

## 2024-06-23 NOTE — Assessment & Plan Note (Addendum)
 Lipids did not improved with diet changes. Crestor  recommended but patient is not taking it yet. She realizes that she will not improve enough with diet only. Will start the Crestor  5 mg and follow up for labs in 3 months.

## 2024-06-23 NOTE — Assessment & Plan Note (Addendum)
 Well controlled blood pressure today. Systolic slightly elevated but acceptable. Current regimen is losartan . No medication side effects noted.

## 2024-06-23 NOTE — Assessment & Plan Note (Addendum)
 She did not start metformin  due to elevated A1C of 6.9. A1C today = 6.5 Will start metformin  ER 500 mg once a day. Follow up in 3 months for labs

## 2024-07-10 ENCOUNTER — Encounter: Payer: Self-pay | Admitting: Gastroenterology

## 2024-07-31 SURGERY — COLONOSCOPY
Anesthesia: General

## 2024-09-26 ENCOUNTER — Ambulatory Visit: Admitting: Student

## 2049-09-19 HISTORY — DX: Type 2 diabetes mellitus without complications: E11.9

## 2049-09-19 HISTORY — DX: Pure hypercholesterolemia, unspecified: E78.00

## 2049-10-03 ENCOUNTER — Ambulatory Visit: Admit: 2049-10-03 | Admitting: Gastroenterology
